# Patient Record
Sex: Female | Born: 1960 | Race: White | Hispanic: No | Marital: Married | Smoking: Never smoker
Health system: Southern US, Community
[De-identification: ages and names within clinical notes are randomized; demographics above are authoritative.]

## PROBLEM LIST (undated history)

## (undated) DIAGNOSIS — Z309 Encounter for contraceptive management, unspecified: Secondary | ICD-10-CM

## (undated) DIAGNOSIS — T7840XA Allergy, unspecified, initial encounter: Secondary | ICD-10-CM

## (undated) DIAGNOSIS — I10 Essential (primary) hypertension: Secondary | ICD-10-CM

## (undated) DIAGNOSIS — B001 Herpesviral vesicular dermatitis: Secondary | ICD-10-CM

## (undated) HISTORY — DX: Allergy, unspecified, initial encounter: T78.40XA

## (undated) HISTORY — PX: COLONOSCOPY: SHX174

## (undated) HISTORY — DX: Encounter for contraceptive management, unspecified: Z30.9

## (undated) HISTORY — DX: Essential (primary) hypertension: I10

## (undated) HISTORY — DX: Herpesviral vesicular dermatitis: B00.1

---
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
## 1984-07-19 HISTORY — PX: WISDOM TOOTH EXTRACTION: SHX21

## 1998-02-17 ENCOUNTER — Inpatient Hospital Stay (HOSPITAL_COMMUNITY): Admission: AD | Admit: 1998-02-17 | Discharge: 1998-02-19 | Payer: Self-pay | Admitting: Obstetrics and Gynecology

## 1998-06-28 ENCOUNTER — Emergency Department (HOSPITAL_COMMUNITY): Admission: EM | Admit: 1998-06-28 | Discharge: 1998-06-28 | Payer: Self-pay | Admitting: Emergency Medicine

## 1998-12-04 ENCOUNTER — Other Ambulatory Visit: Admission: RE | Admit: 1998-12-04 | Discharge: 1998-12-04 | Payer: Self-pay | Admitting: Obstetrics and Gynecology

## 2000-03-02 ENCOUNTER — Other Ambulatory Visit: Admission: RE | Admit: 2000-03-02 | Discharge: 2000-03-02 | Payer: Self-pay | Admitting: Obstetrics and Gynecology

## 2001-04-21 ENCOUNTER — Other Ambulatory Visit: Admission: RE | Admit: 2001-04-21 | Discharge: 2001-04-21 | Payer: Self-pay | Admitting: Obstetrics and Gynecology

## 2002-04-27 ENCOUNTER — Other Ambulatory Visit: Admission: RE | Admit: 2002-04-27 | Discharge: 2002-04-27 | Payer: Self-pay | Admitting: Obstetrics and Gynecology

## 2003-04-26 ENCOUNTER — Other Ambulatory Visit: Admission: RE | Admit: 2003-04-26 | Discharge: 2003-04-26 | Payer: Self-pay | Admitting: Obstetrics and Gynecology

## 2003-05-06 ENCOUNTER — Other Ambulatory Visit: Admission: RE | Admit: 2003-05-06 | Discharge: 2003-05-06 | Payer: Self-pay | Admitting: Obstetrics and Gynecology

## 2004-05-06 ENCOUNTER — Other Ambulatory Visit: Admission: RE | Admit: 2004-05-06 | Discharge: 2004-05-06 | Payer: Self-pay | Admitting: Obstetrics and Gynecology

## 2005-05-03 ENCOUNTER — Other Ambulatory Visit: Admission: RE | Admit: 2005-05-03 | Discharge: 2005-05-03 | Payer: Self-pay | Admitting: Obstetrics and Gynecology

## 2006-09-29 ENCOUNTER — Ambulatory Visit: Payer: Self-pay | Admitting: Family Medicine

## 2007-10-30 ENCOUNTER — Ambulatory Visit: Payer: Self-pay | Admitting: Family Medicine

## 2008-12-06 ENCOUNTER — Ambulatory Visit: Payer: Self-pay | Admitting: Family Medicine

## 2009-04-14 ENCOUNTER — Ambulatory Visit: Payer: Self-pay | Admitting: Family Medicine

## 2009-10-01 ENCOUNTER — Ambulatory Visit: Payer: Self-pay | Admitting: Family Medicine

## 2009-12-10 ENCOUNTER — Ambulatory Visit: Payer: Self-pay | Admitting: Family Medicine

## 2010-05-28 ENCOUNTER — Ambulatory Visit: Payer: Self-pay | Admitting: Family Medicine

## 2011-01-19 ENCOUNTER — Other Ambulatory Visit: Payer: Self-pay | Admitting: Family Medicine

## 2011-01-19 ENCOUNTER — Telehealth: Payer: Self-pay | Admitting: Family Medicine

## 2011-01-19 MED ORDER — FLUTICASONE PROPIONATE 50 MCG/ACT NA SUSP: 2.0000 | Freq: Every day | NASAL | Status: DC

## 2011-01-19 NOTE — Telephone Encounter (Signed)
Flonase renewed. 

## 2011-02-01 ENCOUNTER — Encounter: Payer: Self-pay | Admitting: Family Medicine

## 2011-02-02 ENCOUNTER — Encounter: Payer: Self-pay | Admitting: Medical

## 2011-02-02 ENCOUNTER — Ambulatory Visit (INDEPENDENT_AMBULATORY_CARE_PROVIDER_SITE_OTHER): Payer: 59 | Admitting: Medical

## 2011-02-02 DIAGNOSIS — B009 Herpesviral infection, unspecified: Secondary | ICD-10-CM

## 2011-02-02 DIAGNOSIS — H7390 Unspecified disorder of tympanic membrane, unspecified ear: Secondary | ICD-10-CM

## 2011-02-02 DIAGNOSIS — J309 Allergic rhinitis, unspecified: Secondary | ICD-10-CM

## 2011-02-02 DIAGNOSIS — B001 Herpesviral vesicular dermatitis: Secondary | ICD-10-CM

## 2011-02-02 MED ORDER — FLUTICASONE PROPIONATE 50 MCG/ACT NA SUSP: 2.0000 | Freq: Every day | NASAL | Status: DC

## 2011-02-02 MED ORDER — ACYCLOVIR-HYDROCORTISONE 5-1 % EX CREA: 1.0000 "application " | TOPICAL_CREAM | Freq: Every day | CUTANEOUS | Status: AC

## 2011-02-02 NOTE — Progress Notes (Signed)
  Subjective:   HPI  Julia Romero is a 50 y.o. female who presents for general recheck.  Sees gynecology once yearly, in December, and is UTD on pap and mammo.    Been getting fever blisters of recent.  Gets these once every few months now.  Use to get these all the time as a kid.  Thinks stress or not drinking enough water is contributing to the recent outbreaks.    Here for recheck on allergies.  Needs refills.  Well controlled on daily regimen of Zyrtec and Flonase.   Denies adverse effects of the medication.   No other aggravating or relieving factors.  Otherwise been in normal state of health, exercises regularly, eats healthy.  No other c/o.  The following portions of the patient's history were reviewed and updated as appropriate: allergies, current medications, past family history, past medical history, past social history, past surgical history and problem list.  Past Medical History  Diagnosis Date  . Allergy     RHINITIS  . Herpes labialis   . Contraception management     sees gynecology    Review of Systems Constitutional: denies fever, chills, sweats Allergy: no congestion, sneezing ENT: no runny nose, ear pain, sore throat, hoarseness, sinus pain Cardiology: denies chest pain, palpitations, edema Respiratory: denies cough, shortness of breath, wheezing Gastroenterology: denies abdominal pain, nausea, vomiting, diarrhea, constipation Neurology: no headache, weakness, tingling, numbness      Objective:   Physical Exam  General appearance: alert, no distress, WD/WN, white female HEENT: normocephalic, sclerae anicteric, PERRLA, EOMi, right TM with scar tissue posteriorly, left TM with posterior roundish somewhat retracted lesion with central opaque bulges, asymmetric compared to the right, nares patent, no discharge or erythema, pharynx normal Oral cavity: MMM, no lesions Neck: supple, no lymphadenopathy, no thyromegaly, no masses Heart: RRR, normal S1, S2, no  murmurs Lungs: CTA bilaterally, no wheezes, rhonchi, or rales   Assessment :    Encounter Diagnoses  Name Primary?  . Allergic rhinitis Yes  . Herpes labialis   . Abnormal tympanic membrane      Plan:   Allergic rhinitis - does well on current regimen of Zyrtec and Flonase, uses daily year round.  C/t same meds.  Herpes Labialis - trial of Acyclovir-Hydrocortisone cream prn for flares.  She does not want to use tablets or suppressive therapy.  Discussed proper use of medication, means of transmission.  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  Will refer to ENT for further eval.   We will send for copy of gyn notes and recent comprehensive labs.

## 2011-10-01 ENCOUNTER — Ambulatory Visit (INDEPENDENT_AMBULATORY_CARE_PROVIDER_SITE_OTHER): Payer: 59 | Admitting: Family Medicine

## 2011-10-01 ENCOUNTER — Encounter: Payer: Self-pay | Admitting: Family Medicine

## 2011-10-01 VITALS — BP 120/80 | HR 98 | Temp 97.7°F | Resp 16 | Wt 187.0 lb

## 2011-10-01 DIAGNOSIS — J309 Allergic rhinitis, unspecified: Secondary | ICD-10-CM

## 2011-10-01 DIAGNOSIS — R05 Cough: Secondary | ICD-10-CM

## 2011-10-01 DIAGNOSIS — B001 Herpesviral vesicular dermatitis: Secondary | ICD-10-CM | POA: Insufficient documentation

## 2011-10-01 DIAGNOSIS — R059 Cough, unspecified: Secondary | ICD-10-CM

## 2011-10-01 MED ORDER — HYDROCODONE-HOMATROPINE 5-1.5 MG/5ML PO SYRP: 5.0000 mL | ORAL_SOLUTION | Freq: Three times a day (TID) | ORAL | Status: AC | PRN

## 2011-10-01 MED ORDER — BENZONATATE 200 MG PO CAPS: 200.0000 mg | ORAL_CAPSULE | Freq: Three times a day (TID) | ORAL | Status: AC | PRN

## 2011-10-01 MED ORDER — ALBUTEROL SULFATE (5 MG/ML) 0.5% IN NEBU
2.5000 mg | INHALATION_SOLUTION | Freq: Once | RESPIRATORY_TRACT | Status: AC
  Administered 2011-10-01: 2.5 mg via RESPIRATORY_TRACT

## 2011-10-01 NOTE — Progress Notes (Signed)
Patient presents with complaint of chest congestion, nasal congestion, cough.  Symptoms began 3 days ago, getting worse.  Has been using generic Mucinex.  Cough is dry.  +postnasal drainage.  Nose is stuffy, can't blow anything out.  She feels that her allergies are flaring.  Takes Zyrtec and flonase daily.  +itchy eyes x 1 week.  Thought she may have had a fever 2 nights ago because she woke up sweating, none since. +sore throat, hoarseness.  One night woke up with her chest feeling like it was on fire and had a hard time breathing, no similar problems since.    Past Medical History  Diagnosis Date  . Allergy     RHINITIS  . Herpes labialis   . Contraception management     sees gynecology    No past surgical history on file.  History   Social History  . Marital Status: Married    Spouse Name: N/A    Number of Children: N/A  . Years of Education: N/A   Occupational History  . Not on file.   Social History Main Topics  . Smoking status: Never Smoker   . Smokeless tobacco: Never Used  . Alcohol Use: 5.0 oz/week    10 drink(s) per week  . Drug Use: No  . Sexually Active: Not on file     exercise - body pump and zumba; [employer] services   Other Topics Concern  . Not on file   Social History Narrative  . No narrative on file    Family History  Problem Relation Age of Onset  . Hypertension Mother   . Diabetes Maternal Aunt   . Cancer Maternal Uncle   . Cancer Paternal Aunt   . Diabetes Paternal Aunt   . Arthritis Maternal Grandmother   . Heart disease Neg Hx   . Stroke Neg Hx   . COPD Neg Hx    Current Outpatient Prescriptions on File Prior to Visit  Medication Sig Dispense Refill  . Acyclovir-Hydrocortisone 5-1 % CREA Apply 1 application topically 5 (five) times daily. Apply 5 times daily x 5 days  5 g  5  . cetirizine (ZYRTEC) 10 MG tablet Take 10 mg by mouth daily.        . fluticasone (FLONASE) 50 MCG/ACT nasal spray Place 2 sprays into the nose daily.  16 g  11    No current facility-administered medications on file prior to visit.   No Known Allergies  ROS:  Denies nausea, vomiting, diarrhea, skin rash, myalgias/arthralgias.  Denies headaches/dizziness.  See HPI  PHYSICAL EXAM: BP 120/80  Pulse 98  Temp(Src) 97.7 F (36.5 C) (Oral)  Resp 16  Wt 187 lb (84.823 kg)  SpO2 98% Well developed, pleasant female, in no distress, but with dry hacky cough.  Increased cough with speaking, forced expiration HEENT:  PERRL, EOMI, conjunctiva clear.  TM's and EACs normal. OP white phlegm posteriorly Sinuses nontender Nasal mucosa mildly edematous with clear mucus Neck: no lymphadenopathy Heart: regular rate and rhythm without murmur Lungs: clear bilaterally.  Slightly decreased breath sounds, and some wheezing with forced expiration. No wheezes, rales, ronchi After neb--slightly improved air movement.  Pt didn't feel significantly improved. Skin: no rash Psych: normal mood, affect  ASSESSMENT/PLAN: 1. Allergic rhinitis, cause unspecified    2. Cough  benzonatate (TESSALON) 200 MG capsule, HYDROcodone-homatropine (HYCODAN) 5-1.5 MG/5ML syrup, albuterol (PROVENTIL) (5 MG/ML) 0.5% nebulizer solution 2.5 mg   Add decongestant.  Continue Mucinex (plain, or if you get Mucinex-D,  then don't get separate decongestant). Continue allergy meds--Flonase and Zyrtec Tessalon during day Hycodan at night Call for antibiotics if ongoing cough, or develops discolored mucus/phlegm.

## 2011-10-01 NOTE — Patient Instructions (Signed)
  Add decongestant (ie Sudafed).  Continue Mucinex (plain, or if you get Mucinex-D, then don't get separate decongestant). Continue allergy meds--Flonase and Zyrtec Tessalon during day if needed for cough Hycodan at night for cough Call for antibiotics if ongoing cough, or develops discolored mucus/phlegm.

## 2011-12-08 ENCOUNTER — Encounter: Payer: Self-pay | Admitting: Family Medicine

## 2011-12-08 ENCOUNTER — Ambulatory Visit (INDEPENDENT_AMBULATORY_CARE_PROVIDER_SITE_OTHER): Payer: 59 | Admitting: Family Medicine

## 2011-12-08 VITALS — BP 126/90 | HR 90 | Wt 182.0 lb

## 2011-12-08 DIAGNOSIS — M7650 Patellar tendinitis, unspecified knee: Secondary | ICD-10-CM

## 2011-12-08 MED ORDER — DICLOFENAC SODIUM 75 MG PO TBEC: 75.0000 mg | DELAYED_RELEASE_TABLET | Freq: Two times a day (BID) | ORAL | Status: AC

## 2011-12-08 NOTE — Progress Notes (Signed)
  Subjective:    Patient ID: Julia Romero, female    DOB: 09/14/1960, 51 y.o.   MRN: [id number]  HPI Approximately 3 days ago she noted the onset of bilateral lateral knee pain. Both sides is in exactly the same area. Any activity makes it worse. It is especially bothersome up and down steps. She's had no new vigorous physical activities. No history of injury. No other joints are involved. She has been using 2 Aleve twice a day without relief. She then switch to ibuprofen 600 mg 4 times per day without Review of Systems     Objective:   Physical Exam Slight effusion is noted bilaterally as well as point tenderness over the proximal lateral patellar tendon at the insertion into the patella no joint line tenderness. McMurray's testing negative. These exams were all bilateral exam     Assessment & Plan:   1. Patellar tendinitis, unspecified laterality  diclofenac (VOLTAREN) 75 MG EC tablet  Heat for 20 minutes 3 times per day. Take the new medication regularly. If continued difficulty return here. You can also add Tylenol to your regimen.

## 2011-12-08 NOTE — Patient Instructions (Addendum)
Heat for 20 minutes 3 times per day. Take the new medication regularly. If continued difficulty return here. You can also add Tylenol to your regimen.

## 2012-03-14 ENCOUNTER — Other Ambulatory Visit: Payer: Self-pay | Admitting: Medical

## 2012-03-14 ENCOUNTER — Telehealth: Payer: Self-pay | Admitting: Family Medicine

## 2012-03-14 MED ORDER — FLUTICASONE PROPIONATE 50 MCG/ACT NA SUSP: 2.0000 | Freq: Every day | NASAL | Status: DC

## 2012-03-14 NOTE — Telephone Encounter (Signed)
Flonase called in

## 2012-04-21 ENCOUNTER — Other Ambulatory Visit: Payer: Self-pay | Admitting: Family Medicine

## 2012-04-21 NOTE — Telephone Encounter (Signed)
Is this ok?

## 2012-04-21 NOTE — Telephone Encounter (Signed)
Go ahead and renew this 

## 2012-06-01 ENCOUNTER — Ambulatory Visit (INDEPENDENT_AMBULATORY_CARE_PROVIDER_SITE_OTHER): Payer: 59 | Admitting: Family Medicine

## 2012-06-01 ENCOUNTER — Encounter: Payer: Self-pay | Admitting: Family Medicine

## 2012-06-01 VITALS — BP 126/82 | HR 76 | Wt 189.0 lb

## 2012-06-01 DIAGNOSIS — J019 Acute sinusitis, unspecified: Secondary | ICD-10-CM

## 2012-06-01 DIAGNOSIS — H6690 Otitis media, unspecified, unspecified ear: Secondary | ICD-10-CM

## 2012-06-01 DIAGNOSIS — H6691 Otitis media, unspecified, right ear: Secondary | ICD-10-CM

## 2012-06-01 MED ORDER — AMOXICILLIN 875 MG PO TABS: 875.0000 mg | ORAL_TABLET | Freq: Two times a day (BID) | ORAL | Status: DC

## 2012-06-01 NOTE — Patient Instructions (Signed)
Take all the antibiotic and return here in about 2 weeks

## 2012-06-01 NOTE — Progress Notes (Signed)
  Subjective:    Patient ID: Julia Romero, female    DOB: 1960/11/16, 51 y.o.   MRN: [id number]  HPI Approximately one week ago she noted sinus pressure, PND, rhinorrhea. She states that the congestion is worse and now has increased pressure and dry cough. He also developed some right ear pain. No fever, chills, sore throat area she did try Mucinex.   Review of Systems     Objective:   Physical Exam alert and in no distress. Left tympanic membrane is normal, right is vascular with poor anatomic landmarks canals are normal. Throat is clear. Tonsils are normal. Neck is supple without adenopathy or thyromegaly. Cardiac exam shows a regular sinus rhythm without murmurs or gallops. Lungs are clear to auscultation. Tender to palpation over all sinuses. Nasal mucosa slightly erythematous.       Assessment & Plan:   1. ROM (right otitis media)  amoxicillin (AMOXIL) 875 MG tablet  2. Acute sinusitis  amoxicillin (AMOXIL) 875 MG tablet   she is to return here in 2 weeks for recheck.

## 2012-06-09 ENCOUNTER — Encounter: Payer: Self-pay | Admitting: Internal Medicine

## 2012-06-12 ENCOUNTER — Telehealth: Payer: Self-pay | Admitting: Family Medicine

## 2012-06-12 DIAGNOSIS — J019 Acute sinusitis, unspecified: Secondary | ICD-10-CM

## 2012-06-12 DIAGNOSIS — H6691 Otitis media, unspecified, right ear: Secondary | ICD-10-CM

## 2012-06-12 MED ORDER — AMOXICILLIN 875 MG PO TABS: 875.0000 mg | ORAL_TABLET | Freq: Two times a day (BID) | ORAL | Status: DC

## 2012-06-12 NOTE — Telephone Encounter (Signed)
Patient still having symptoms. I will call in  another dose of Amoxil

## 2012-06-12 NOTE — Telephone Encounter (Signed)
Let her I called in the Amoxil

## 2012-06-12 NOTE — Telephone Encounter (Signed)
Pt informed of med has been called in

## 2012-06-18 LAB — HM PAP SMEAR: HM Pap smear: NORMAL

## 2012-06-20 ENCOUNTER — Ambulatory Visit (INDEPENDENT_AMBULATORY_CARE_PROVIDER_SITE_OTHER): Payer: 59 | Admitting: Family Medicine

## 2012-06-20 VITALS — BP 120/80 | HR 73 | Wt 189.0 lb

## 2012-06-20 DIAGNOSIS — H6690 Otitis media, unspecified, unspecified ear: Secondary | ICD-10-CM

## 2012-06-20 DIAGNOSIS — H6693 Otitis media, unspecified, bilateral: Secondary | ICD-10-CM

## 2012-06-20 MED ORDER — AMOXICILLIN 875 MG PO TABS: 875.0000 mg | ORAL_TABLET | Freq: Two times a day (BID) | ORAL | Status: DC

## 2012-06-20 NOTE — Patient Instructions (Signed)
Take ALL of the antibiotic and call me if you're not back to normal

## 2012-06-20 NOTE — Progress Notes (Signed)
  Subjective:    Patient ID: Julia Romero, female    DOB: 1960/11/20, 51 y.o.   MRN: [id number]  HPI She is here for recheck. She stopped taking her antibiotic after one week but did note some ear congestion but no fever or chills.   Review of Systems     Objective:   Physical Exam Alert and in no distress. Both tympanic membranes show good anatomic landmarks however the membranes are slightly retracted with some fluid behind it.       Assessment & Plan:   1. BOM (bilateral otitis media)    she will take a full ten-day course of the antibiotic and if not improved back to normal, will give me a call.

## 2012-08-11 ENCOUNTER — Ambulatory Visit (INDEPENDENT_AMBULATORY_CARE_PROVIDER_SITE_OTHER): Payer: 59 | Admitting: Family Medicine

## 2012-08-11 DIAGNOSIS — T23209A Burn of second degree of unspecified hand, unspecified site, initial encounter: Secondary | ICD-10-CM

## 2012-08-11 DIAGNOSIS — T23202A Burn of second degree of left hand, unspecified site, initial encounter: Secondary | ICD-10-CM

## 2012-08-11 NOTE — Progress Notes (Signed)
  Subjective:    Patient ID: Julia Romero, female    DOB: [birth year], 52 y.o.   MRN: [id number]  HPI She sustained a burn to the base of her left index finger earlier today.   Review of Systems     Objective:   Physical Exam 1 x 2 cm slightly red blistered lesion noted on the palm are and dorsal surface of the left index finger just distal to the MCP joint is noted. The blisters have opened spontaneously.      Assessment & Plan:   1. Second degree burn of left hand    the wound was cleaned and dressed. Instructed the patient on proper care of the wound and to watch for signs of infection. He the wound covered until it becomes dry and in fact that to protect against further injury. Call if any questions about infection.

## 2012-08-28 ENCOUNTER — Ambulatory Visit (INDEPENDENT_AMBULATORY_CARE_PROVIDER_SITE_OTHER): Payer: 59 | Admitting: Internal Medicine

## 2012-08-28 ENCOUNTER — Telehealth: Payer: Self-pay | Admitting: Family Medicine

## 2012-08-28 VITALS — BP 147/85 | HR 74 | Temp 98.2°F | Resp 18 | Ht 66.0 in | Wt 188.0 lb

## 2012-08-28 DIAGNOSIS — J329 Chronic sinusitis, unspecified: Secondary | ICD-10-CM

## 2012-08-28 MED ORDER — PREDNISONE 20 MG PO TABS: ORAL_TABLET | ORAL | Status: DC

## 2012-08-28 MED ORDER — AMOXICILLIN-POT CLAVULANATE 875-125 MG PO TABS: 1.0000 | ORAL_TABLET | Freq: Two times a day (BID) | ORAL | Status: DC

## 2012-08-28 NOTE — Progress Notes (Signed)
  Subjective:    Patient ID: Julia Romero, female    DOB: 01/27/1961, 52 y.o.   MRN: [id number]  HPI complaining of sinus congestion that is very uncomfortable over the past 6 weeks History of chronic sinus problems Had 2 rounds of antibiotics in December and only partially improved The past 2 weeks has had purulent discharge in the morning but the rest of the day it's hard to clear Difficulty breathing at night On Flonase History of allergic rhinitis on Zyrtec Using Sudafed   Review of Systems     Objective:   Physical Exam Overweight Blood pressure 147/85 TMs with scars Nares boggy with little air space/purulence present Throat clear No nodes       Assessment & Plan:  Chronic sinusitis  Meds ordered this encounter  Medications  . amoxicillin-clavulanate (AUGMENTIN) 875-125 MG per tablet    Sig: Take 1 tablet by mouth 2 (two) times daily.    Dispense:  20 tablet    Refill:  1  . predniSONE (DELTASONE) 20 MG tablet    Sig: 3/3/2/2/1/1 single daily dose for 6 days    Dispense:  12 tablet    Refill:  0   If no response in 3 weeks call for referral to Dr. Thurmon [facility] practice/she was seen by them years ago

## 2012-08-28 NOTE — Telephone Encounter (Signed)
Says sinus infection again but I do not see that I would treat her for sinus infection therefore will thinks appropriate to see her social schedule an appointment for tomorrow

## 2012-08-28 NOTE — Telephone Encounter (Signed)
Sinus infection again, would like to have Rx called in   Please call patient  [facility] [address]

## 2012-08-28 NOTE — Telephone Encounter (Signed)
CALLED PT SHE SAID SHE DO SENT HAVE TIME TO COME IN SO SHE WILL GO TO URGENT CARE

## 2013-04-16 ENCOUNTER — Ambulatory Visit (INDEPENDENT_AMBULATORY_CARE_PROVIDER_SITE_OTHER): Payer: 59 | Admitting: Family Medicine

## 2013-04-16 ENCOUNTER — Encounter: Payer: Self-pay | Admitting: Family Medicine

## 2013-04-16 ENCOUNTER — Encounter: Payer: Self-pay | Admitting: Internal Medicine

## 2013-04-16 VITALS — BP 120/82 | HR 72 | Wt 198.0 lb

## 2013-04-16 DIAGNOSIS — Z23 Encounter for immunization: Secondary | ICD-10-CM

## 2013-04-16 DIAGNOSIS — B001 Herpesviral vesicular dermatitis: Secondary | ICD-10-CM

## 2013-04-16 DIAGNOSIS — Z79899 Other long term (current) drug therapy: Secondary | ICD-10-CM

## 2013-04-16 DIAGNOSIS — B009 Herpesviral infection, unspecified: Secondary | ICD-10-CM

## 2013-04-16 DIAGNOSIS — J309 Allergic rhinitis, unspecified: Secondary | ICD-10-CM

## 2013-04-16 MED ORDER — ACYCLOVIR-HYDROCORTISONE 5-1 % EX CREA: TOPICAL_CREAM | CUTANEOUS | Status: DC

## 2013-04-16 MED ORDER — AZELASTINE-FLUTICASONE 137-50 MCG/ACT NA SUSP: 1.0000 | NASAL | Status: DC

## 2013-04-16 NOTE — Progress Notes (Signed)
  Subjective:    Patient ID: Julia Romero, female    DOB: 1960/07/20, 52 y.o.   MRN: [id number]  HPI She is here for medication check. She does have underlying allergies and has been using Zyrtec as well as Flonase. She would like to try a new medication with the combination of these two thinking this also might help with cost. She seems to be doing well on the combination mentioned above. She also would like a refill on her herpes labialis cream stating this works quite well. Review his record indicates she is now over 50 and has not had a colonoscopy.   Review of Systems     Objective:   Physical Exam Alert and in no distress otherwise not examined       Assessment & Plan:  Allergic rhinitis, cause unspecified - Plan: Azelastine-Fluticasone (DYMISTA) 137-50 MCG/ACT SUSP  Herpes labialis - Plan: Acyclovir-Hydrocortisone (XERESE) 5-1 % CREA  Need for prophylactic vaccination and inoculation against influenza - Plan: Flu Vaccine QUAD 36+ mos IM  Encounter for long-term (current) use of other medications - Plan: Ambulatory referral to Gastroenterology  flu shot given with risks and benefits discussed. Her medications were renewed. I discussed the fact that her insurance might not cover this medication and if so, she will let me know.

## 2013-04-17 ENCOUNTER — Telehealth: Payer: Self-pay | Admitting: Internal Medicine

## 2013-04-17 MED ORDER — FLUTICASONE PROPIONATE 50 MCG/ACT NA SUSP: 2.0000 | Freq: Every day | NASAL | Status: DC

## 2013-04-17 MED ORDER — AZELASTINE HCL 0.1 % NA SOLN: 1.0000 | Freq: Two times a day (BID) | NASAL | Status: DC

## 2013-04-17 NOTE — Telephone Encounter (Signed)
Fax came in stating that insurance will not pay for dymista that it will cover flonase or astelin. So please send in a new rx to [facility] [address]

## 2013-04-24 ENCOUNTER — Ambulatory Visit (INDEPENDENT_AMBULATORY_CARE_PROVIDER_SITE_OTHER): Payer: 59 | Admitting: Family Medicine

## 2013-04-24 DIAGNOSIS — L909 Atrophic disorder of skin, unspecified: Secondary | ICD-10-CM

## 2013-04-24 DIAGNOSIS — L918 Other hypertrophic disorders of the skin: Secondary | ICD-10-CM

## 2013-04-24 NOTE — Progress Notes (Signed)
  Subjective:    Patient ID: Julia Romero, female    DOB: 1960/09/11, 52 y.o.   MRN: [id number]  HPI She has 2 skin tags in her left axilla that are giving him trouble when she wears clothing. She gets some caught on them and they become irritated.   Review of Systems     Objective:   Physical Exam Exam of the left axilla shows 2 skin tags however they're not raw and irritated.       Assessment & Plan:  Cutaneous skin tags  skin tags were cleaned with alcohol and removed with scissors. The base was hyfrecated with silver nitrate. She tolerated the procedure well.

## 2013-06-06 ENCOUNTER — Ambulatory Visit (AMBULATORY_SURGERY_CENTER): Payer: Self-pay | Admitting: *Deleted

## 2013-06-06 VITALS — Ht 65.5 in | Wt 197.2 lb

## 2013-06-06 DIAGNOSIS — Z1211 Encounter for screening for malignant neoplasm of colon: Secondary | ICD-10-CM

## 2013-06-06 MED ORDER — MOVIPREP 100 G PO SOLR: ORAL | Status: DC

## 2013-06-06 NOTE — Progress Notes (Signed)
No allergies to eggs or soy. No problems with anesthesia.  

## 2013-06-07 ENCOUNTER — Encounter: Payer: Self-pay | Admitting: Internal Medicine

## 2013-06-21 ENCOUNTER — Encounter: Payer: Self-pay | Admitting: Internal Medicine

## 2013-06-21 ENCOUNTER — Ambulatory Visit (AMBULATORY_SURGERY_CENTER): Payer: 59 | Admitting: Internal Medicine

## 2013-06-21 VITALS — BP 133/86 | HR 61 | Temp 99.4°F | Resp 18 | Ht 65.0 in | Wt 197.0 lb

## 2013-06-21 DIAGNOSIS — D126 Benign neoplasm of colon, unspecified: Secondary | ICD-10-CM

## 2013-06-21 DIAGNOSIS — Z1211 Encounter for screening for malignant neoplasm of colon: Secondary | ICD-10-CM

## 2013-06-21 LAB — HM COLONOSCOPY

## 2013-06-21 MED ORDER — SODIUM CHLORIDE 0.9 % IV SOLN: 500.0000 mL | INTRAVENOUS | Status: DC

## 2013-06-21 NOTE — Patient Instructions (Signed)
YOU HAD AN ENDOSCOPIC PROCEDURE TODAY AT THE [facility]: Refer to the procedure report that was given to you for any specific questions about what was found during the examination.  If the procedure report does not answer your questions, please call your gastroenterologist to clarify.  If you requested that your care partner not be given the details of your procedure findings, then the procedure report has been included in a sealed envelope for you to review at your convenience later.  YOU SHOULD EXPECT: Some feelings of bloating in the abdomen. Passage of more gas than usual.  Walking can help get rid of the air that was put into your GI tract during the procedure and reduce the bloating. If you had a lower endoscopy (such as a colonoscopy or flexible sigmoidoscopy) you may notice spotting of blood in your stool or on the toilet paper. If you underwent a bowel prep for your procedure, then you may not have a normal bowel movement for a few days.  DIET: Your first meal following the procedure should be a light meal and then it is ok to progress to your normal diet.  A half-sandwich or bowl of soup is an example of a good first meal.  Heavy or fried foods are harder to digest and may make you feel nauseous or bloated.  Likewise meals heavy in dairy and vegetables can cause extra gas to form and this can also increase the bloating.  Drink plenty of fluids but you should avoid alcoholic beverages for 24 hours.  ACTIVITY: Your care partner should take you home directly after the procedure.  You should plan to take it easy, moving slowly for the rest of the day.  You can resume normal activity the day after the procedure however you should NOT DRIVE or use heavy machinery for 24 hours (because of the sedation medicines used during the test).    SYMPTOMS TO REPORT IMMEDIATELY: A gastroenterologist can be reached at any hour.  During normal business hours, 8:30 AM to 5:00 PM Monday through Friday,  call [phone].  After hours and on weekends, please call the GI answering service at [phone]  Emergency number who will take a message and have the physician on call contact you.   Following lower endoscopy (colonoscopy or flexible sigmoidoscopy):  Excessive amounts of blood in the stool  Significant tenderness or worsening of abdominal pains  Swelling of the abdomen that is new, acute  Fever of 100F or higher  FOLLOW UP: If any biopsies were taken you will be contacted by phone or by letter within the next 1-3 weeks.  Call your gastroenterologist if you have not heard about the biopsies in 3 weeks.  Our staff will call the home number listed on your records the next business day following your procedure to check on you and address any questions or concerns that you may have at that time regarding the information given to you following your procedure. This is a courtesy call and so if there is no answer at the home number and we have not heard from you through the emergency physician on call, we will assume that you have returned to your regular daily activities without incident.  SIGNATURES/CONFIDENTIALITY: You and/or your care partner have signed paperwork which will be entered into your electronic medical record.  These signatures attest to the fact that that the information above on your After Visit Summary has been reviewed and is understood.  Full responsibility of the   confidentiality of this discharge information lies with you and/or your care-partner.  HANDOUT ON POLYPS

## 2013-06-21 NOTE — Progress Notes (Signed)
Called to room to assist during endoscopic procedure.  Patient ID and intended procedure confirmed with present staff. Received instructions for my participation in the procedure from the performing physician.  

## 2013-06-21 NOTE — Op Note (Signed)
[facility] [address] [place], [address]   COLONOSCOPY PROCEDURE REPORT  PATIENT: Julia Romero, Julia Romero  MR#: [id number] BIRTHDATE: [birth year] , 52  yrs. old GENDER: Female ENDOSCOPIST: Roxy Cedar, MD REFERRED XB:JYNW Susann Givens, M.D. PROCEDURE DATE:  06/21/2013 PROCEDURE:   Colonoscopy with snare polypectomy x 2 First Screening Colonoscopy - Avg.  risk and is 50 yrs.  old or older Yes.  Prior Negative Screening - Now for repeat screening. N/A  History of Adenoma - Now for follow-up colonoscopy & has been > or = to 3 yrs.  N/A  Polyps Removed Today? Yes. ASA CLASS:   Class II INDICATIONS:average risk screening. MEDICATIONS: MAC sedation, administered by CRNA and propofol (Diprivan) 500mg  IV  DESCRIPTION OF PROCEDURE:   After the risks benefits and alternatives of the procedure were thoroughly explained, informed consent was obtained.  A digital rectal exam revealed no abnormalities of the rectum.   The LB [id number]  endoscope was introduced through the anus and advanced to the cecum, which was identified by both the appendix and ileocecal valve. No adverse events experienced.   The quality of the prep was excellent, using MoviPrep  The instrument was then slowly withdrawn as the colon was fully examined.  COLON FINDINGS: Three diminutive polyps were found in the ascending colon and transverse colon.  A polypectomy was performed with a cold snare.  The resection was complete and the polyp tissue was completely retrieved.   The colon was otherwise normal.  There was no diverticulosis, inflammation, other polyps or cancers . Retroflexed views revealed internal hemorrhoids. The time to cecum=10 minutes 30 seconds.  Withdrawal time=10 minutes 12 seconds.  The scope was withdrawn and the procedure completed.  COMPLICATIONS: There were no complications.  ENDOSCOPIC IMPRESSION: 1.   Three diminutive polyps were found in the colon; polypectomy was  performed with a cold snare 2.   The colon was otherwise normal  RECOMMENDATIONS: 1. Repeat colonoscopy in 5 years if polyp adenomatous; otherwise 10 years   eSigned:  Roxy Cedar, MD 06/21/2013 12:11 PM   cc: Sharlot Gowda, MD and The Patient

## 2013-06-21 NOTE — Progress Notes (Signed)
Patient did not experience any of the following events: a burn prior to discharge; a fall within the facility; wrong site/side/patient/procedure/implant event; or a hospital transfer or hospital admission upon discharge from the facility. (G8907)Patient did not have preoperative order for IV antibiotic SSI prophylaxis. (G8918) EWM 

## 2013-06-22 ENCOUNTER — Telehealth: Payer: Self-pay | Admitting: *Deleted

## 2013-06-22 NOTE — Telephone Encounter (Signed)
  Follow up Call-  Call back number 06/21/2013  Post procedure Call Back phone  # [phone]  Permission to leave phone message Yes     Patient questions:  Do you have a fever, pain , or abdominal swelling? no Pain Score  0 *  Have you tolerated food without any problems? yes  Have you been able to return to your normal activities? yes  Do you have any questions about your discharge instructions: Diet   no Medications  no Follow up visit  no  Do you have questions or concerns about your Care? no  Actions: * If pain score is 4 or above: No action needed, pain <4.

## 2013-06-26 ENCOUNTER — Encounter: Payer: Self-pay | Admitting: Internal Medicine

## 2013-07-09 LAB — HM MAMMOGRAPHY: HM MAMMO: NORMAL

## 2013-08-13 ENCOUNTER — Encounter: Payer: Self-pay | Admitting: Medical

## 2013-08-13 ENCOUNTER — Ambulatory Visit (INDEPENDENT_AMBULATORY_CARE_PROVIDER_SITE_OTHER): Payer: 59 | Admitting: Medical

## 2013-08-13 VITALS — BP 120/80 | HR 67 | Temp 98.0°F | Resp 16 | Wt 194.0 lb

## 2013-08-13 DIAGNOSIS — H6690 Otitis media, unspecified, unspecified ear: Secondary | ICD-10-CM

## 2013-08-13 MED ORDER — AMOXICILLIN 875 MG PO TABS: 875.0000 mg | ORAL_TABLET | Freq: Two times a day (BID) | ORAL | Status: DC

## 2013-08-13 NOTE — Progress Notes (Signed)
Subjective:   Julia Romero is a 53 y.o. female who presents with ear pain and possible ear infection.  She notes she has had a cold or head congestion for 12 days, including ear pressure, ear popping, cough, head congestion, some chest congestion. Most of the symptoms have improved using Sudafed and Mucinex, however her ears are really hurting her.   Denies fever, sore throat, severe cough, chest congestion, wheezing or shortness of breath, no nausea or vomiting. Denies sick contacts.  No other aggravating or relieving factors.  No other c/o.  ROS as in subjective  Objective:  Filed Vitals:   08/13/13 1425  BP: 120/80  Pulse: 67  Temp: 98 F (36.7 C)  Resp: 16    General appearance: Alert, WD/WN, no distress, mildly ill appearing                             Skin: warm, no rash                           Head: no sinus tenderness                            Eyes: conjunctiva normal, corneas clear, PERRLA                            Ears: Bilateral TMs with retraction, serous effusions, mild erythema, external ear canals normal                          Nose: septum midline, turbinates swollen, with erythema and clear discharge             Mouth/throat: MMM, tongue normal, mild pharyngeal erythema                           Neck: supple, no adenopathy, no thyromegaly, nontender                         Lungs: CTA bilaterally, no wheezes, rales, or rhonchi     Assessment: Encounter Diagnosis  Name Primary?  . Otitis media Yes    Plan: Prescription for amoxicillin given as below.  Discussed diagnosis and treatment, increase Flonase to twice a day for the next week or 2, continue over-the-counter remedies she is using, good hydration.  Tylenol or Ibuprofen OTC for fever and malaise.  Call/return in 2-3 days if symptoms aren't resolving.

## 2013-08-22 ENCOUNTER — Telehealth: Payer: Self-pay | Admitting: Medical

## 2013-08-22 ENCOUNTER — Other Ambulatory Visit: Payer: Self-pay | Admitting: Medical

## 2013-08-22 MED ORDER — AMOXICILLIN-POT CLAVULANATE 875-125 MG PO TABS: 1.0000 | ORAL_TABLET | Freq: Two times a day (BID) | ORAL | Status: DC

## 2013-08-22 NOTE — Telephone Encounter (Signed)
So does she still have ear pain, sinus pressure?  Is she using her 2 nasal sprays?  Using and nasal saline  I did send Augmentin, stronger antibiotic, but find out about the above.

## 2013-08-22 NOTE — Telephone Encounter (Signed)
Patient states that she is still having sinus pressure and her ear's are a little better. She states that she is still using the nasal sprays. Patient is aware of the antibiotic. CLS

## 2013-10-25 ENCOUNTER — Telehealth: Payer: Self-pay | Admitting: Internal Medicine

## 2013-10-25 NOTE — Telephone Encounter (Signed)
Faxed over medical records to [facility]

## 2014-05-09 ENCOUNTER — Ambulatory Visit (INDEPENDENT_AMBULATORY_CARE_PROVIDER_SITE_OTHER): Payer: 59 | Admitting: Family Medicine

## 2014-05-09 ENCOUNTER — Encounter: Payer: Self-pay | Admitting: Family Medicine

## 2014-05-09 VITALS — BP 120/80 | HR 75 | Ht 67.0 in | Wt 195.0 lb

## 2014-05-09 DIAGNOSIS — Z23 Encounter for immunization: Secondary | ICD-10-CM

## 2014-05-09 DIAGNOSIS — J301 Allergic rhinitis due to pollen: Secondary | ICD-10-CM

## 2014-05-09 DIAGNOSIS — B001 Herpesviral vesicular dermatitis: Secondary | ICD-10-CM

## 2014-05-09 MED ORDER — AZELASTINE HCL 0.1 % NA SOLN: 1.0000 | Freq: Two times a day (BID) | NASAL | Status: DC

## 2014-05-09 MED ORDER — ACYCLOVIR-HYDROCORTISONE 5-1 % EX CREA: TOPICAL_CREAM | CUTANEOUS | Status: DC

## 2014-05-09 MED ORDER — FLUTICASONE PROPIONATE 50 MCG/ACT NA SUSP: 1.0000 | Freq: Every day | NASAL | Status: DC

## 2014-05-09 NOTE — Progress Notes (Signed)
   Subjective:    Patient ID: Julia Romero, female    DOB: 1961/07/05, 53 y.o.   MRN: [id number]  HPI She is here for an interval evaluation. She would like refills on several of her medicines specifically for her recurrent herpes labialis. She does have underlying allergies and would like to continue on her present medications. She is doing well on them. She has no other concerns or complaints. Her immunizations and health maintenance were reviewed. Family and social history are unchanged. She has no particular concerns or complaints.   Review of Systems     Objective:   Physical Exam alert and in no distress. Tympanic membranes and canals are normal. Throat is clear. Tonsils are normal. Neck is supple without adenopathy or thyromegaly. Cardiac exam shows a regular sinus rhythm without murmurs or gallops. Lungs are clear to auscultation.        Assessment & Plan:  Herpes labialis - Plan: Acyclovir-Hydrocortisone (XERESE) 5-1 % CREA  Allergic rhinitis due to pollen - Plan: azelastine (ASTELIN) 0.1 % nasal spray, fluticasone (FLONASE) 50 MCG/ACT nasal spray  Need for prophylactic vaccination and inoculation against influenza - Plan: Flu Vaccine QUAD 36+ mos IM

## 2014-05-19 ENCOUNTER — Ambulatory Visit (INDEPENDENT_AMBULATORY_CARE_PROVIDER_SITE_OTHER): Payer: 59 | Admitting: Emergency Medicine

## 2014-05-19 ENCOUNTER — Ambulatory Visit (INDEPENDENT_AMBULATORY_CARE_PROVIDER_SITE_OTHER): Payer: 59

## 2014-05-19 VITALS — BP 118/78 | HR 79 | Temp 98.3°F | Resp 16 | Ht 67.0 in | Wt 194.0 lb

## 2014-05-19 DIAGNOSIS — M25531 Pain in right wrist: Secondary | ICD-10-CM

## 2014-05-19 DIAGNOSIS — M654 Radial styloid tenosynovitis [de Quervain]: Secondary | ICD-10-CM

## 2014-05-19 MED ORDER — NAPROXEN SODIUM 550 MG PO TABS: 550.0000 mg | ORAL_TABLET | Freq: Two times a day (BID) | ORAL | Status: AC

## 2014-05-19 NOTE — Patient Instructions (Signed)
De Quervain's Tenosynovitis De Quervain's tenosynovitis involves inflammation of one or two tendon linings (sheaths) or strain of one or two tendons to the thumb: extensor pollicis brevis (EPB), or abductor pollicis longus (APL). This causes pain on the side of the wrist and base of the thumb. Tendon sheaths secrete a fluid that lubricates the tendon, allowing the tendon to move smoothly. When the sheath becomes inflamed, the tendon cannot move freely in the sheath. Both the EPB and APL tendons are important for proper use of the hand. The EPB tendon is important for straightening the thumb. The APL tendon is important for moving the thumb away from the index finger (abducting). The two tendons pass through a small tube (canal) in the wrist, near the base of the thumb. When the tendons become inflamed, pain is usually felt in this area. SYMPTOMS   Pain, tenderness, swelling, warmth, or redness over the base of the thumb and thumb side of the wrist.  Pain that gets worse when straightening the thumb.  Pain that gets worse when moving the thumb away from the index finger, against resistance.  Pain with pinching or gripping.  Locking or catching of the thumb.  Limited motion of the thumb.  Crackling sound (crepitation) when the tendon or thumb is moved or touched.  Fluid-filled cyst in the area of the base of the thumb. CAUSES   Tenosynovitis is often linked with overuse of the wrist.  Tenosynovitis may be caused by repeated injury to the thumb muscle and tendon units, and with repeated motions of the hand and wrist, due to friction of the tendon within the lining (sheath).  Tenosynovitis may also be due to a sudden increase in activity or change in activity. RISK INCREASES WITH:  Sports that involve repeated hand and wrist motions (golf, bowling, tennis, squash, racquetball).  Heavy labor.  Poor physical wrist strength and flexibility.  Failure to warm up properly before practice or  play.  Female gender.  New mothers who hold their baby's head for long periods or lift infants with thumbs in the infant's armpit (axilla). PREVENTION  Warm up and stretch properly before practice or competition.  Allow enough time for rest and recovery between practices and competition.  Maintain appropriate conditioning:  Cardiovascular fitness.  Forearm, wrist, and hand flexibility.  Muscle strength and endurance.  Use proper exercise technique. PROGNOSIS  This condition is usually curable within 6 weeks, if treated properly with non-surgical treatment and resting of the affected area.  RELATED COMPLICATIONS   Longer healing time if not properly treated or if not given enough time to heal.  Chronic inflammation, causing recurring symptoms of tenosynovitis. Permanent pain or restriction of movement.  Risks of surgery: infection, bleeding, injury to nerves (numbness of the thumb), continued pain, incomplete release of the tendon sheath, recurring symptoms, cutting of the tendons, tendons sliding out of position, weakness of the thumb, thumb stiffness. TREATMENT  First, treatment involves the use of medicine and ice, to reduce pain and inflammation. Patients are encouraged to stop or modify activities that aggravate the injury. Stretching and strengthening exercises may be advised. Exercises may be completed at home or with a therapist. You may be fitted with a brace or splint, to limit motion and allow the injury to heal. Your caregiver may also choose to give you a corticosteroid injection, to reduce the pain and inflammation. If non-surgical treatment is not successful, surgery may be needed. Most tenosynovitis surgeries are done as outpatient procedures (you go home the   same day). Surgery may involve local, regional (whole arm), or general anesthesia.  MEDICATION   If pain medicine is needed, nonsteroidal anti-inflammatory medicines (aspirin and ibuprofen), or other minor pain  relievers (acetaminophen), are often advised.  Do not take pain medicine for 7 days before surgery.  Prescription pain relievers are often prescribed only after surgery. Use only as directed and only as much as you need.  Corticosteroid injections may be given if your caregiver thinks they are needed. There is a limited number of times these injections may be given. COLD THERAPY   Cold treatment (icing) should be applied for 10 to 15 minutes every 2 to 3 hours for inflammation and pain, and immediately after activity that aggravates your symptoms. Use ice packs or an ice massage. SEEK MEDICAL CARE IF:   Symptoms get worse or do not improve in 2 to 4 weeks, despite treatment.  You experience pain, numbness, or coldness in the hand.  Blue, gray, or dark color appears in the fingernails.  Any of the following occur after surgery: increased pain, swelling, redness, drainage of fluids, bleeding in the affected area, or signs of infection.  New, unexplained symptoms develop. (Drugs used in treatment may produce side effects.) Document Released: 07/05/2005 Document Revised: 09/27/2011 Document Reviewed: 10/17/2008 ExitCare Patient Information 2015 [employer]. This information is not intended to replace advice given to you by your health care provider. Make sure you discuss any questions you have with your health care provider.   

## 2014-05-19 NOTE — Progress Notes (Signed)
[facility] [address], [place] [address] [phone]  Date:  05/19/2014   Name:  Julia Romero   DOB:  [birth year]   MRN:  [id number]  PCP:  Wyatt Haste, MD    Chief Complaint: Wrist Pain   History of Present Illness:  Julia Romero is a 53 y.o. very pleasant female patient who presents with the following:  Working in the yard yesterday pulling weeds.  Later in the day she developed pain in the radial aspect of the wrist No direct injury by history. No history of inflammatory joint disease or connective tissue disease No fever or chills, redness or swelling. No improvement with over the counter medications or other home remedies. Denies other complaint or health concern today.   Patient Active Problem List   Diagnosis Date Noted  . Allergic rhinitis 10/01/2011  . Herpes labialis 10/01/2011    Past Medical History  Diagnosis Date  . Allergy     RHINITIS  . Herpes labialis   . Contraception management     sees gynecology    Past Surgical History  Procedure Laterality Date  . Tonsillectomy and adenoidectomy  1970  . Tubes in ears  1973  . Wisdom tooth extraction  1986    History  Substance Use Topics  . Smoking status: Never Smoker   . Smokeless tobacco: Never Used  . Alcohol Use: 5.0 oz/week    10 drink(s) per week    Family History  Problem Relation Age of Onset  . Hypertension Mother   . Diabetes Maternal Aunt   . Cancer Maternal Uncle   . Cancer Paternal Aunt   . Diabetes Paternal Aunt   . Arthritis Maternal Grandmother   . Heart disease Neg Hx   . Stroke Neg Hx   . COPD Neg Hx   . Colon cancer Neg Hx     No Known Allergies  Medication list has been reviewed and updated.  Current Outpatient Prescriptions on File Prior to Visit  Medication Sig Dispense Refill  . Acyclovir-Hydrocortisone (XERESE) 5-1 % CREA APPLY TO AFFECTED AREAS 5 TIMES DAILY FOR 5 DAYS 5 g 11  . azelastine (ASTELIN) 0.1 % nasal spray  Place 1 spray into both nostrils 2 (two) times daily. 30 mL 11  . fluticasone (FLONASE) 50 MCG/ACT nasal spray Place 1 spray into both nostrils daily. 16 g 11  . levonorgestrel-ethinyl estradiol (NORDETTE) 0.15-30 MG-MCG tablet Take 1 tablet by mouth daily.     No current facility-administered medications on file prior to visit.    Review of Systems:  As per HPI, otherwise negative.    Physical Examination: Filed Vitals:   05/19/14 1017  BP: 118/78  Pulse: 79  Temp: 98.3 F (36.8 C)  Resp: 16   Filed Vitals:   05/19/14 1017  Height: 5\' 7"  (1.702 m)  Weight: 194 lb (87.998 kg)   Body mass index is 30.38 kg/(m^2). Ideal Body Weight: Weight in (lb) to have BMI = 25: 159.3   GEN: WDWN, NAD, Non-toxic, Alert & Oriented x 3 HEENT: Atraumatic, Normocephalic.  Ears and Nose: No external deformity. EXTR: No clubbing/cyanosis/edema NEURO: Normal gait.  PSYCH: Normally interactive. Conversant. Not depressed or anxious appearing.  Calm demeanor.  RIGHT wrist:  No tenderness, ecchymosis or swelling.  Finklestein's positive  Assessment and Plan: De Quervains tenosynovitis Anaprox Splint  Follow up 1 week   Signed,  Ellison Carwin, MD   [facility] reading (PRIMARY) by  Dr. Ouida Sills. negative.

## 2014-06-05 ENCOUNTER — Telehealth: Payer: Self-pay | Admitting: Family Medicine

## 2014-06-05 NOTE — Telephone Encounter (Signed)
Talked with pt and advised her Dr.Lalonde did not know of anything cheaper

## 2014-06-05 NOTE — Telephone Encounter (Signed)
I don't know of anything less expensive

## 2014-06-05 NOTE — Telephone Encounter (Signed)
Pt called and stated that when she went to pick up medication, xerese, it had a 35 dollar copay. She wanted to know if there was anything less expensive. [facility] [address]

## 2015-07-07 ENCOUNTER — Encounter: Payer: Self-pay | Admitting: Family Medicine

## 2015-07-07 ENCOUNTER — Ambulatory Visit (INDEPENDENT_AMBULATORY_CARE_PROVIDER_SITE_OTHER): Payer: 59 | Admitting: Family Medicine

## 2015-07-07 VITALS — BP 128/80 | HR 80 | Resp 14 | Wt 205.2 lb

## 2015-07-07 DIAGNOSIS — N649 Disorder of breast, unspecified: Secondary | ICD-10-CM | POA: Diagnosis not present

## 2015-07-07 DIAGNOSIS — Z23 Encounter for immunization: Secondary | ICD-10-CM | POA: Diagnosis not present

## 2015-07-07 DIAGNOSIS — Z86010 Personal history of colonic polyps: Secondary | ICD-10-CM | POA: Diagnosis not present

## 2015-07-07 DIAGNOSIS — B001 Herpesviral vesicular dermatitis: Secondary | ICD-10-CM

## 2015-07-07 DIAGNOSIS — Z79899 Other long term (current) drug therapy: Secondary | ICD-10-CM

## 2015-07-07 DIAGNOSIS — J301 Allergic rhinitis due to pollen: Secondary | ICD-10-CM | POA: Insufficient documentation

## 2015-07-07 DIAGNOSIS — L988 Other specified disorders of the skin and subcutaneous tissue: Secondary | ICD-10-CM

## 2015-07-07 LAB — CBC WITH DIFFERENTIAL/PLATELET
Basophils Absolute: 0 10*3/uL (ref 0.0–0.1)
Basophils Relative: 0 % (ref 0–1)
Eosinophils Absolute: 0.1 10*3/uL (ref 0.0–0.7)
Eosinophils Relative: 1 % (ref 0–5)
HCT: 44.3 % (ref 36.0–46.0)
Hemoglobin: 14.7 g/dL (ref 12.0–15.0)
LYMPHS ABS: 2.2 10*3/uL (ref 0.7–4.0)
Lymphocytes Relative: 29 % (ref 12–46)
MCH: 30.1 pg (ref 26.0–34.0)
MCHC: 33.2 g/dL (ref 30.0–36.0)
MCV: 90.6 fL (ref 78.0–100.0)
MPV: 10.7 fL (ref 8.6–12.4)
Monocytes Absolute: 0.5 10*3/uL (ref 0.1–1.0)
Monocytes Relative: 7 % (ref 3–12)
NEUTROS PCT: 63 % (ref 43–77)
Neutro Abs: 4.9 10*3/uL (ref 1.7–7.7)
PLATELETS: 288 10*3/uL (ref 150–400)
RBC: 4.89 MIL/uL (ref 3.87–5.11)
RDW: 13.3 % (ref 11.5–15.5)
WBC: 7.7 10*3/uL (ref 4.0–10.5)

## 2015-07-07 LAB — LIPID PANEL
CHOLESTEROL: 209 mg/dL — AB (ref 125–200)
HDL: 46 mg/dL (ref >=46)
LDL Cholesterol: 142 mg/dL — ABNORMAL HIGH (ref <130)
TRIGLYCERIDES: 105 mg/dL (ref <150)
Total CHOL/HDL Ratio: 4.5 Ratio (ref <=5.0)
VLDL: 21 mg/dL (ref <30)

## 2015-07-07 LAB — COMPREHENSIVE METABOLIC PANEL
ALBUMIN: 3.9 g/dL (ref 3.6–5.1)
ALT: 27 U/L (ref 6–29)
AST: 33 U/L (ref 10–35)
Alkaline Phosphatase: 42 U/L (ref 33–130)
BUN: 11 mg/dL (ref 7–25)
CHLORIDE: 103 mmol/L (ref 98–110)
CO2: 24 mmol/L (ref 20–31)
Calcium: 9.2 mg/dL (ref 8.6–10.4)
Creat: 0.79 mg/dL (ref 0.50–1.05)
Glucose, Bld: 91 mg/dL (ref 65–99)
POTASSIUM: 4 mmol/L (ref 3.5–5.3)
Sodium: 136 mmol/L (ref 135–146)
TOTAL PROTEIN: 6.9 g/dL (ref 6.1–8.1)
Total Bilirubin: 0.5 mg/dL (ref 0.2–1.2)

## 2015-07-07 MED ORDER — AZELASTINE HCL 0.1 % NA SOLN: 1.0000 | Freq: Two times a day (BID) | NASAL | Status: DC

## 2015-07-07 MED ORDER — VALACYCLOVIR HCL 1 G PO TABS: 1000.0000 mg | ORAL_TABLET | Freq: Every day | ORAL | Status: DC

## 2015-07-07 MED ORDER — ACYCLOVIR-HYDROCORTISONE 5-1 % EX CREA: TOPICAL_CREAM | CUTANEOUS | Status: DC

## 2015-07-07 NOTE — Progress Notes (Signed)
   Subjective:    Patient ID: Julia Romero, female    DOB: 08/13/1960, 54 y.o.   MRN: [id number]  HPI She is here a medication management visit. She does have a history of herpes labialis and is using topical medication. She describes having as many as one per month. She does have underlying allergies and is using Astelin with good results.She also has history of colonic polypsand did have a colonoscopy in 2014. She is scheduled for another one in 2019. She has a lesion present on her left breast that she thinks is growing. Review his record indicates no recent routine labs have been drawn.  Review of Systems     Objective:   Physical Exam Alert and in no distress. Tympanic membranes and canals are normal. Pharyngeal area is normal. Neck is supple without adenopathy or thyromegaly. Cardiac exam shows a regular sinus rhythm without murmurs or gallops. Lungs are clear to auscultation. Exam of the left breast does show a round well demarcated pigmented lesion to the medial aspect of the breast.       Assessment & Plan:  Herpes labialis - Plan: valACYclovir (VALTREX) 1000 MG tablet, Acyclovir-Hydrocortisone (XERESE) 5-1 % CREA  Non-seasonal allergic rhinitis due to pollen - Plan: azelastine (ASTELIN) 0.1 % nasal spray  Need for prophylactic vaccination and inoculation against influenza - Plan: Flu Vaccine QUAD 36+ mos IM  History of colonic polyps - Plan: CBC with Differential/Platelet, Comprehensive metabolic panel  Encounter for long-term (current) use of medications - Plan: CBC with Differential/Platelet, Comprehensive metabolic panel, Lipid panel  Lesion of skin of breast Recommend she return here for lunch biopsy of the skin lesion however it really does appear benign.She will start on Valtrex to suppress the herpes labialis. Her immunizations were updated.The topical medication was also prescribed.

## 2015-07-09 ENCOUNTER — Other Ambulatory Visit: Payer: Self-pay | Admitting: Family Medicine

## 2015-07-29 ENCOUNTER — Ambulatory Visit (INDEPENDENT_AMBULATORY_CARE_PROVIDER_SITE_OTHER): Payer: 59 | Admitting: Family Medicine

## 2015-07-29 ENCOUNTER — Encounter: Payer: Self-pay | Admitting: Family Medicine

## 2015-07-29 VITALS — BP 148/98 | HR 78 | Ht 67.0 in | Wt 206.0 lb

## 2015-07-29 DIAGNOSIS — N390 Urinary tract infection, site not specified: Secondary | ICD-10-CM

## 2015-07-29 DIAGNOSIS — R350 Frequency of micturition: Secondary | ICD-10-CM | POA: Diagnosis not present

## 2015-07-29 DIAGNOSIS — IMO0001 Reserved for inherently not codable concepts without codable children: Secondary | ICD-10-CM

## 2015-07-29 LAB — POCT URINALYSIS DIPSTICK
BILIRUBIN UA: NEGATIVE
Glucose, UA: NEGATIVE
NITRITE UA: NEGATIVE
PH UA: 6
PROTEIN UA: NEGATIVE
Spec Grav, UA: >=1.03
Urobilinogen, UA: NEGATIVE

## 2015-07-29 MED ORDER — SULFAMETHOXAZOLE-TRIMETHOPRIM 800-160 MG PO TABS: 1.0000 | ORAL_TABLET | Freq: Two times a day (BID) | ORAL | Status: DC

## 2015-07-29 NOTE — Progress Notes (Signed)
   Subjective:    Patient ID: Julia Romero, female    DOB: 1960-12-27, 55 y.o.   MRN: [id number]  HPI She complains of a several day history of frequency and urgency but no dysuria, fever, chills, abdominal pain.   Review of Systems     Objective:   Physical Exam Alert and in no distress. Urine dipstick was positive.      Assessment & Plan:  Frequency - Plan: POCT urinalysis dipstick, sulfamethoxazole-trimethoprim (BACTRIM DS,SEPTRA DS) 800-160 MG tablet  Acute UTI - Plan: sulfamethoxazole-trimethoprim (BACTRIM DS,SEPTRA DS) 800-160 MG tablet I will treat her with a three-day course. She will call and let me know how she is doing.

## 2015-07-29 NOTE — Patient Instructions (Signed)
Azostandard can help with your symptoms but will make your urine turn orangeish yellow

## 2015-08-04 ENCOUNTER — Telehealth: Payer: Self-pay

## 2015-08-04 DIAGNOSIS — N390 Urinary tract infection, site not specified: Secondary | ICD-10-CM

## 2015-08-04 DIAGNOSIS — IMO0001 Reserved for inherently not codable concepts without codable children: Secondary | ICD-10-CM

## 2015-08-04 MED ORDER — SULFAMETHOXAZOLE-TRIMETHOPRIM 800-160 MG PO TABS: 1.0000 | ORAL_TABLET | Freq: Two times a day (BID) | ORAL | Status: DC

## 2015-08-04 NOTE — Telephone Encounter (Signed)
Pt was seen last week and has called back to let you know she still feels the urge to urinate often and would [facility] [address] [facility] [place]

## 2015-08-04 NOTE — Telephone Encounter (Signed)
Let her know that I called it in 

## 2015-08-04 NOTE — Telephone Encounter (Signed)
Called patient and advised of more abx called to her pharmacy.

## 2015-08-06 IMAGING — CR DG WRIST COMPLETE 3+V*R*
4 series · 4 of 4 positions shown · non-contrast
Comparison: None.

CLINICAL DATA: Wrist pain.

EXAM:
RIGHT WRIST - COMPLETE 3+ VIEW

[PA]
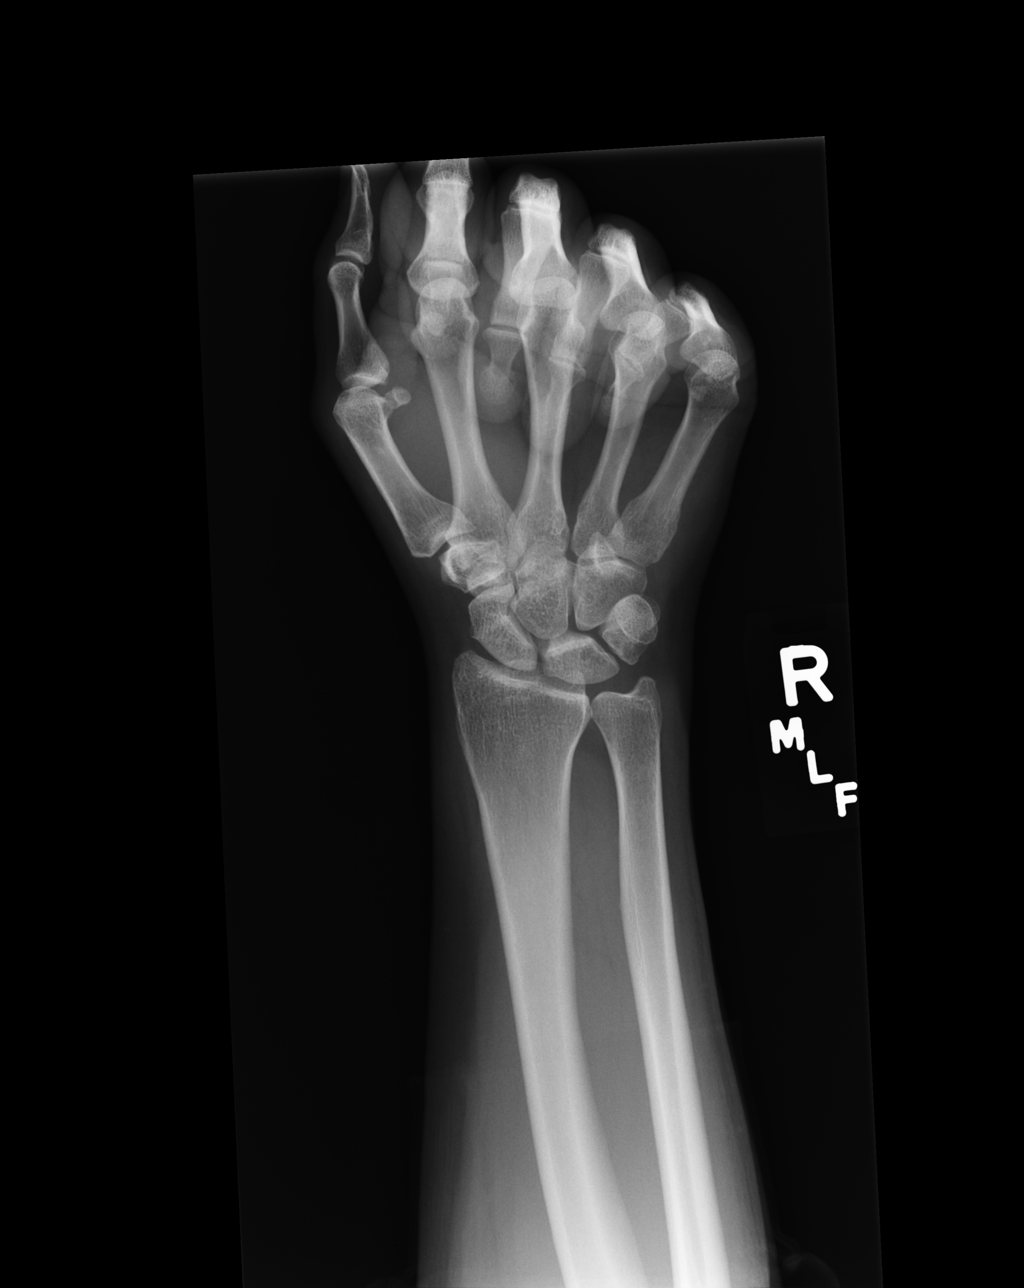

[pa int rot]
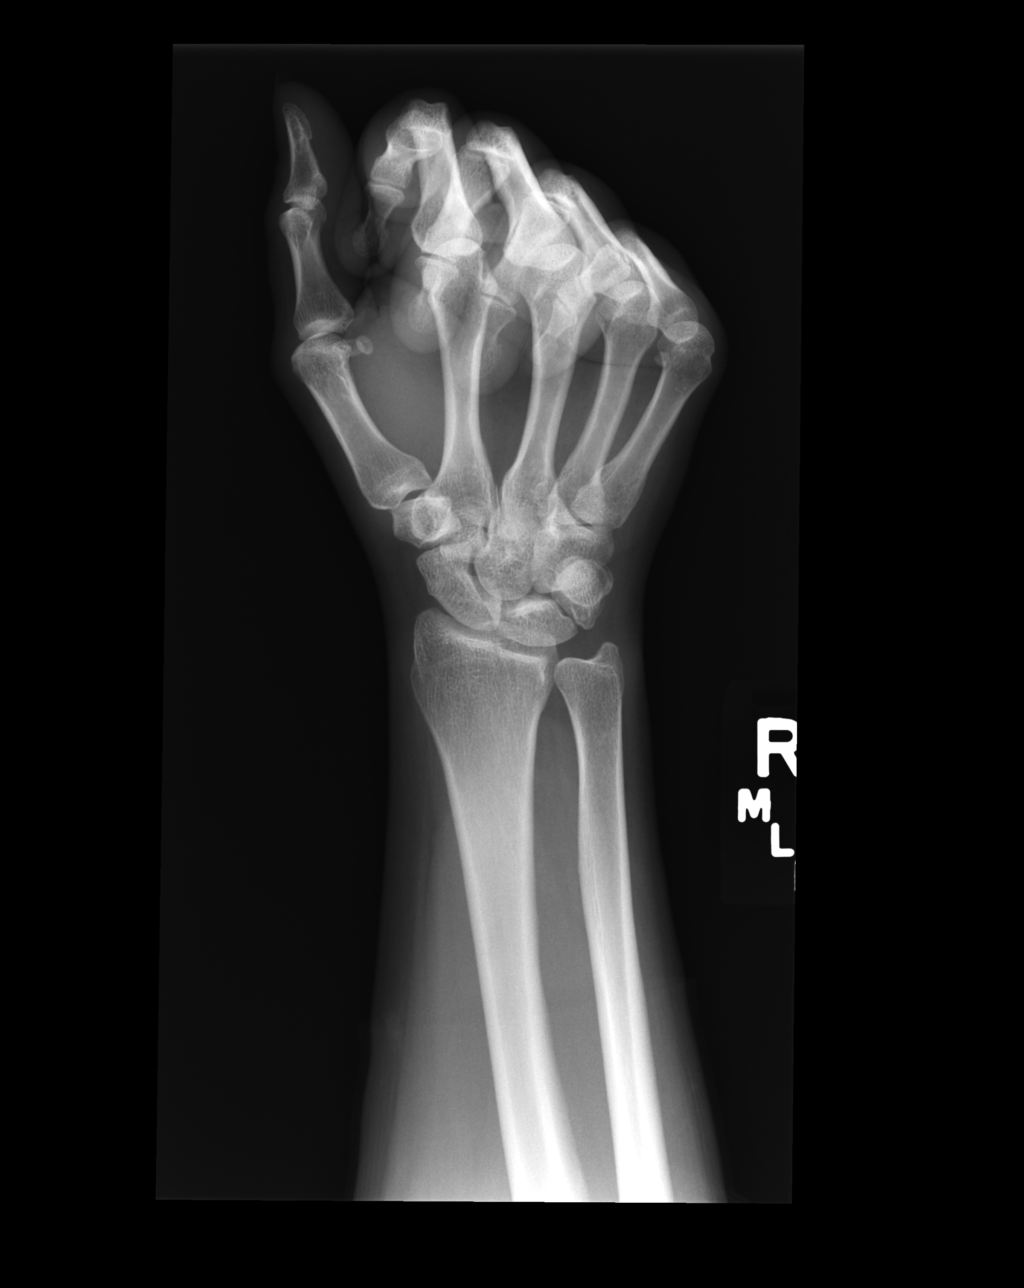

[lateral]
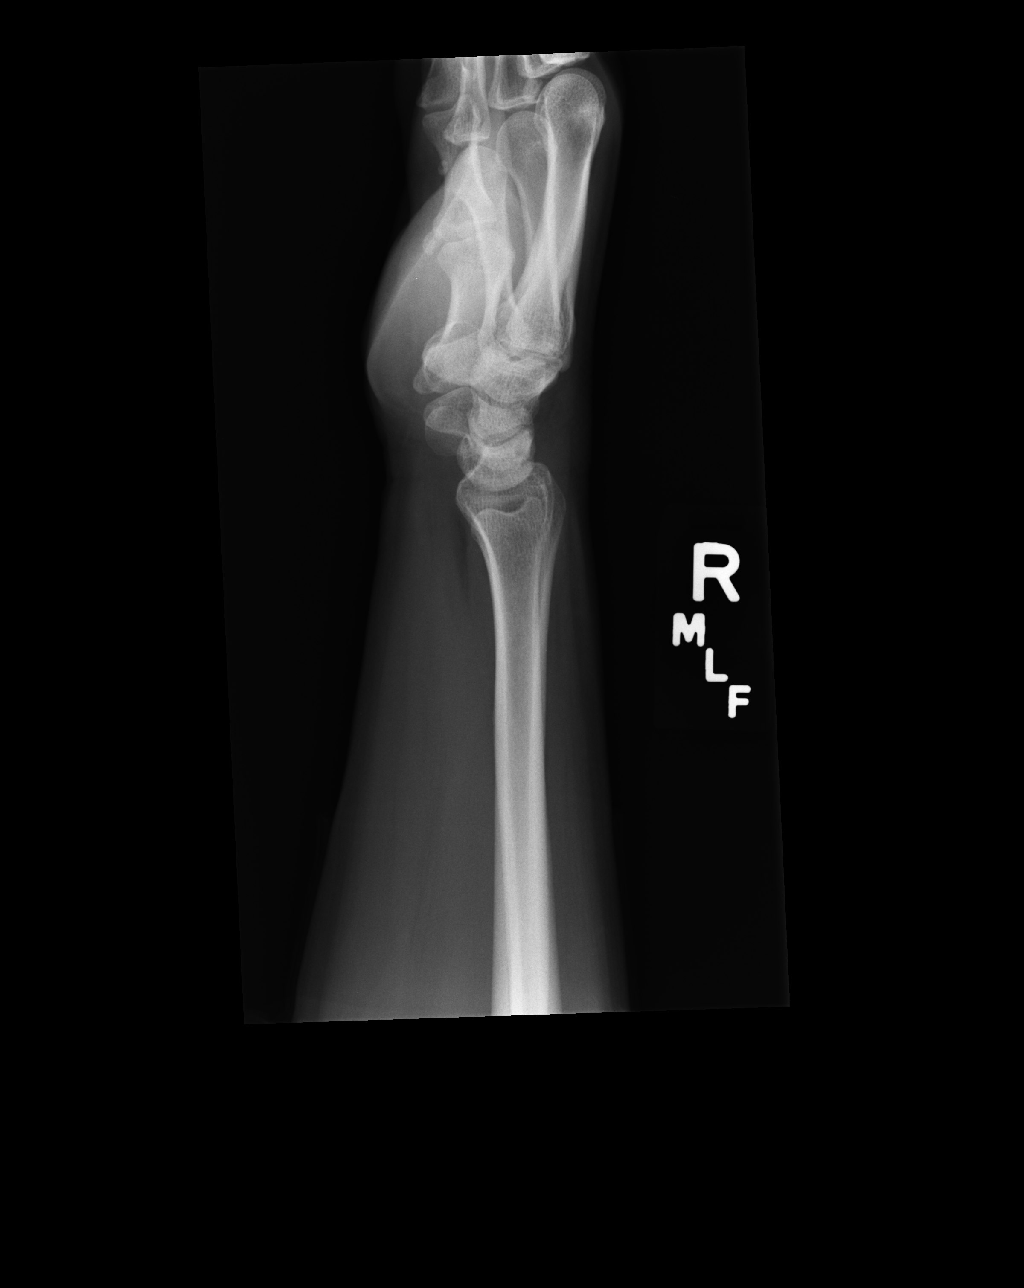

[pa navicular]
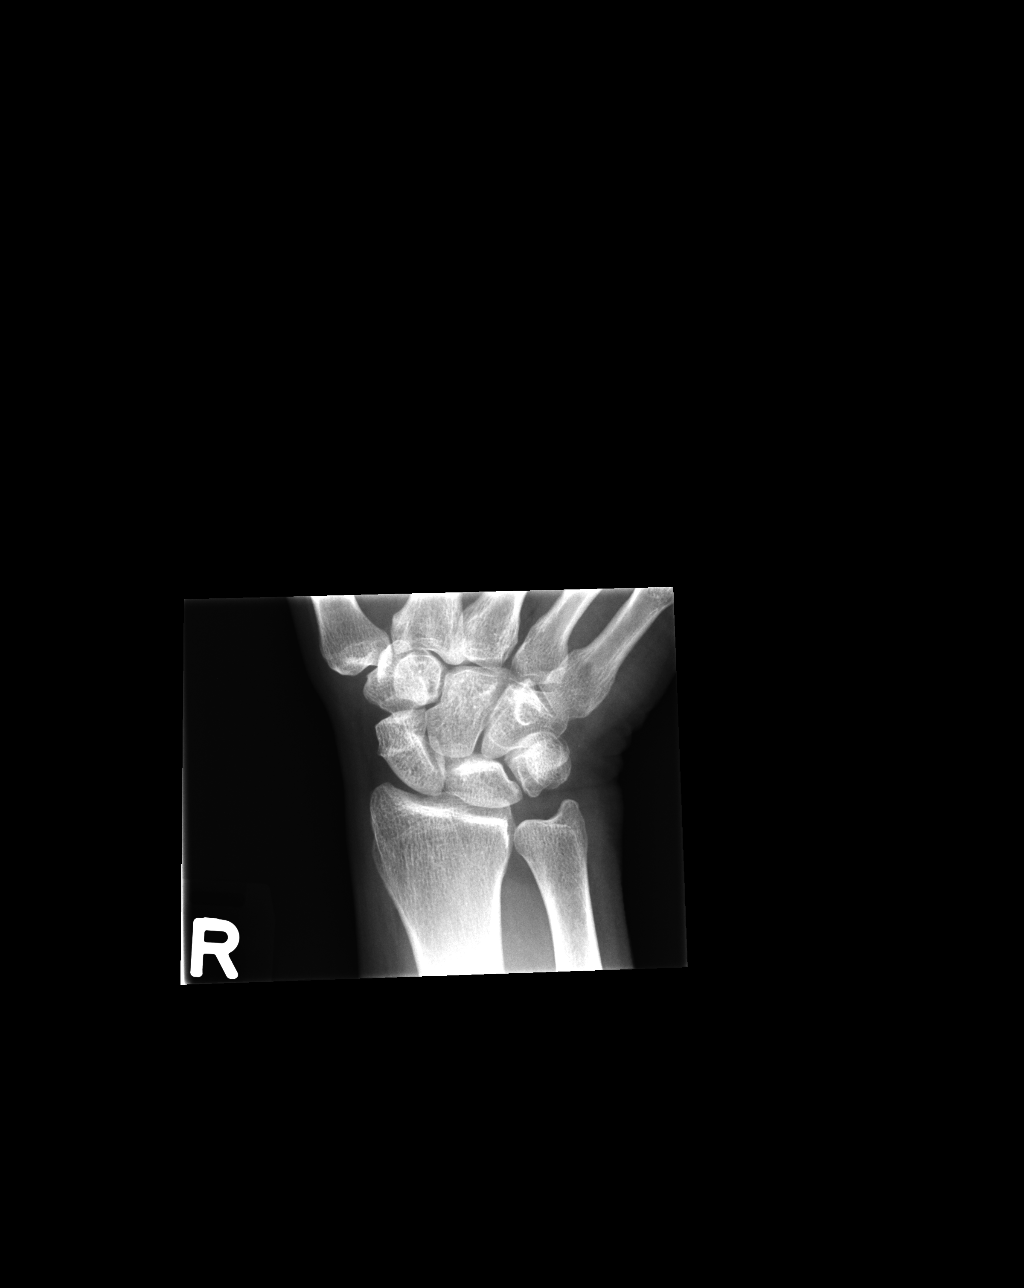

[4 of 4 positions shown; findings below may reference images not displayed]

FINDINGS: There is no evidence of fracture or dislocation. There is no
evidence of arthropathy or other focal bone abnormality. Soft
tissues are unremarkable.
IMPRESSION: No acute abnormality.

## 2015-09-09 LAB — HM PAP SMEAR

## 2015-09-09 LAB — RESULTS CONSOLE HPV: CHL HPV: NEGATIVE

## 2015-11-10 ENCOUNTER — Ambulatory Visit (INDEPENDENT_AMBULATORY_CARE_PROVIDER_SITE_OTHER): Payer: 59 | Admitting: Family Medicine

## 2015-11-10 VITALS — BP 160/100 | HR 78 | Wt 202.6 lb

## 2015-11-10 DIAGNOSIS — R42 Dizziness and giddiness: Secondary | ICD-10-CM

## 2015-11-10 NOTE — Progress Notes (Signed)
   Subjective:    Patient ID: Julia Romero, female    DOB: 1960-09-23, 55 y.o.   MRN: [id number]  HPI She is here for evaluation of dizzy spells that started last Saturday. She woke up and as soon as she got out of bed became quite dizzy. This lasted for several seconds and then went away but did notice with any motion, she would get dizzy again. There was no associated blurred vision, double vision, weakness. She has had no recent illnesses. No sore throat earache or cough. She has had no changes in her medications. She cannot relate this to any particular head position or body position.   Review of Systems     Objective:   Physical Exam Alert and in no distress. EOMI. Cerebellar testing normal. DTRs normal.Tympanic membranes and canals are normal. Pharyngeal area is normal. Neck is supple without adenopathy or thyromegaly. Cardiac exam shows a regular sinus rhythm without murmurs or gallops. Lungs are clear to auscultation.        Assessment & Plan:  Vertigo I explained since she is getting better, further evaluation and intervention is not necessary. Discussed placing her on Antivert however will hold off on this as long she continues to get better. She was comfortable with this.

## 2016-01-05 ENCOUNTER — Other Ambulatory Visit: Payer: Self-pay | Admitting: Family Medicine

## 2016-02-23 ENCOUNTER — Encounter: Payer: Self-pay | Admitting: Family Medicine

## 2016-02-23 ENCOUNTER — Ambulatory Visit (INDEPENDENT_AMBULATORY_CARE_PROVIDER_SITE_OTHER): Payer: 59 | Admitting: Family Medicine

## 2016-02-23 DIAGNOSIS — H6593 Unspecified nonsuppurative otitis media, bilateral: Secondary | ICD-10-CM | POA: Diagnosis not present

## 2016-02-23 NOTE — Progress Notes (Signed)
   Subjective:    Patient ID: Julia Romero, female    DOB: 1961-04-12, 55 y.o.   MRN: [id number]  HPI She complains of a one-week history that started with left earache then progressing to both no sore throat, fever, chills, cough or  congestion   Review of Systems     Objective:   Physical Exam Alert and in no distress. Tympanic membranes are slightly retracted with minimal fluid behind them. canals are normal. Pharyngeal area is normal. Neck is supple without adenopathy or thyromegaly. Cardiac exam shows a regular sinus rhythm without murmurs or gallops. Lungs are clear to auscultation.        Assessment & Plan:  Bilateral serous otitis media, recurrence not specified, unspecified chronicity Recommended judicious use of a decongestant. Also discussed use of Afrin nasal spray but only for short period of time.

## 2016-06-18 ENCOUNTER — Telehealth: Payer: Self-pay | Admitting: Family Medicine

## 2016-06-18 NOTE — Telephone Encounter (Signed)
Will call back to set up a appt.

## 2016-06-30 ENCOUNTER — Other Ambulatory Visit: Payer: Self-pay | Admitting: Family Medicine

## 2016-06-30 DIAGNOSIS — B001 Herpesviral vesicular dermatitis: Secondary | ICD-10-CM

## 2016-06-30 NOTE — Telephone Encounter (Signed)
Is this okay to refill? 

## 2016-09-16 DIAGNOSIS — N951 Menopausal and female climacteric states: Secondary | ICD-10-CM | POA: Diagnosis not present

## 2016-09-16 DIAGNOSIS — Z01419 Encounter for gynecological examination (general) (routine) without abnormal findings: Secondary | ICD-10-CM | POA: Diagnosis not present

## 2016-09-16 DIAGNOSIS — Z1231 Encounter for screening mammogram for malignant neoplasm of breast: Secondary | ICD-10-CM | POA: Diagnosis not present

## 2016-09-16 LAB — HM MAMMOGRAPHY: HM MAMMO: NORMAL (ref 0–4)

## 2016-09-16 LAB — HM PAP SMEAR: HM PAP: NORMAL

## 2016-10-05 ENCOUNTER — Other Ambulatory Visit: Payer: Self-pay

## 2016-10-05 ENCOUNTER — Telehealth: Payer: Self-pay | Admitting: Family Medicine

## 2016-10-05 MED ORDER — FLUTICASONE PROPIONATE 50 MCG/ACT NA SUSP: NASAL | 0 refills | Status: DC

## 2016-10-05 NOTE — Telephone Encounter (Signed)
I have sent in flonase

## 2016-10-05 NOTE — Telephone Encounter (Signed)
Pt called and stated that she has made a appt for a medcheck but will be out of fluticasone before then. [facility] [address] Pt can be reached at [phone].

## 2016-10-13 ENCOUNTER — Ambulatory Visit (INDEPENDENT_AMBULATORY_CARE_PROVIDER_SITE_OTHER): Payer: 59 | Admitting: Family Medicine

## 2016-10-13 ENCOUNTER — Encounter: Payer: Self-pay | Admitting: Family Medicine

## 2016-10-13 VITALS — BP 140/90 | HR 79 | Wt 202.0 lb

## 2016-10-13 DIAGNOSIS — B001 Herpesviral vesicular dermatitis: Secondary | ICD-10-CM

## 2016-10-13 DIAGNOSIS — Z86010 Personal history of colonic polyps: Secondary | ICD-10-CM | POA: Diagnosis not present

## 2016-10-13 DIAGNOSIS — Z23 Encounter for immunization: Secondary | ICD-10-CM

## 2016-10-13 DIAGNOSIS — R030 Elevated blood-pressure reading, without diagnosis of hypertension: Secondary | ICD-10-CM

## 2016-10-13 DIAGNOSIS — J3089 Other allergic rhinitis: Secondary | ICD-10-CM | POA: Diagnosis not present

## 2016-10-13 MED ORDER — BUDESONIDE 32 MCG/ACT NA SUSP: 2.0000 | Freq: Every day | NASAL | 11 refills | Status: DC

## 2016-10-13 MED ORDER — AZELASTINE HCL 0.1 % NA SOLN: 1.0000 | Freq: Two times a day (BID) | NASAL | 11 refills | Status: DC

## 2016-10-13 MED ORDER — CETIRIZINE HCL 10 MG PO TABS: 10.0000 mg | ORAL_TABLET | Freq: Every day | ORAL | 3 refills | Status: DC

## 2016-10-13 MED ORDER — VALACYCLOVIR HCL 1 G PO TABS: 1000.0000 mg | ORAL_TABLET | Freq: Every day | ORAL | 3 refills | Status: DC

## 2016-10-13 NOTE — Patient Instructions (Signed)
You can add Allegra, Claritin or Zyrtec to your regimen. You can also switch from Flonase to Rhinocort and see if that makes a difference

## 2016-10-13 NOTE — Progress Notes (Signed)
   Subjective:    Patient ID: Julia Romero, female    DOB: Jul 14, 1961, 56 y.o.   MRN: [id number]  HPI He is here for medication management visit. She does have seasonal allergies and presently her medication regimen is not controlling her symptoms. She also has a history of colonic polyps and will be having this checked again in 2019. She takes her Valtrex on a regular basis and has not had any outbreaks in the last year. Her immunizations were reviewed and she does need TDaP. She follows up regularly with gynecology and recently has stopped taking her birth control pills she has had difficulty with hot flashes. She does not smoke.   Review of Systems     Objective:   Physical Exam Alert and in no distress. Tympanic membranes and canals are normal. Pharyngeal area is normal. Neck is supple without adenopathy or thyromegaly. Cardiac exam shows a regular sinus rhythm without murmurs or gallops. Lungs are clear to auscultation.        Assessment & Plan:  Need for Tdap vaccination - Plan: Tdap vaccine greater than or equal to 7yo IM  Chronic nonseasonal allergic rhinitis due to pollen - Plan: azelastine (ASTELIN) 0.1 % nasal spray, budesonide (RHINOCORT ALLERGY) 32 MCG/ACT nasal spray, cetirizine (ZYRTEC) 10 MG tablet  History of colonic polyps  Herpes labialis - Plan: valACYclovir (VALTREX) 1000 MG tablet  Elevated blood pressure reading Apparently her insurance will pay for OTC meds. I will therefore switch her to Zyrtec, Rhinocort and continue the Astelin. Also continue Valtrex as she is doing well on this. Her immunizations were updated. I will monitor the blood pressure.

## 2016-10-22 ENCOUNTER — Other Ambulatory Visit: Payer: Self-pay

## 2016-10-22 ENCOUNTER — Telehealth: Payer: Self-pay | Admitting: Family Medicine

## 2016-10-22 DIAGNOSIS — B001 Herpesviral vesicular dermatitis: Secondary | ICD-10-CM

## 2016-10-22 DIAGNOSIS — J3089 Other allergic rhinitis: Secondary | ICD-10-CM

## 2016-10-22 MED ORDER — AZELASTINE HCL 0.1 % NA SOLN: 1.0000 | Freq: Two times a day (BID) | NASAL | 3 refills | Status: DC

## 2016-10-22 MED ORDER — VALACYCLOVIR HCL 1 G PO TABS: 1000.0000 mg | ORAL_TABLET | Freq: Every day | ORAL | 3 refills | Status: DC

## 2016-10-22 MED ORDER — BUDESONIDE 32 MCG/ACT NA SUSP: 2.0000 | Freq: Every day | NASAL | 3 refills | Status: DC

## 2016-10-22 NOTE — Telephone Encounter (Signed)
[facility]

## 2016-10-22 NOTE — Telephone Encounter (Signed)
Faxed request for azelastine, budesonide and valacyclovir to be called to [facility].   Thanks, RLB

## 2016-10-22 NOTE — Telephone Encounter (Signed)
Pt called and stated that she is changing her pharmacy. She is switching to [facility]. You should be receiving refill request from them shortly.

## 2016-10-27 ENCOUNTER — Telehealth: Payer: Self-pay

## 2016-10-27 NOTE — Telephone Encounter (Signed)
Faxed request for cetirizine 90 day supply to [facility]. Julia Romero

## 2016-11-22 ENCOUNTER — Telehealth: Payer: Self-pay

## 2016-11-22 MED ORDER — FLUTICASONE PROPIONATE 50 MCG/ACT NA SUSP: NASAL | 11 refills | Status: DC

## 2016-11-22 NOTE — Telephone Encounter (Signed)
Pt called and request a change in nasal spray from budesonide to fluticasone. She reports ins is not covering budesonide. [phone].

## 2016-12-29 ENCOUNTER — Encounter: Payer: Self-pay | Admitting: Family Medicine

## 2016-12-29 ENCOUNTER — Ambulatory Visit (INDEPENDENT_AMBULATORY_CARE_PROVIDER_SITE_OTHER): Payer: 59 | Admitting: Family Medicine

## 2016-12-29 VITALS — BP 120/80 | HR 72 | Temp 98.2°F | Resp 16 | Wt 196.8 lb

## 2016-12-29 DIAGNOSIS — H6692 Otitis media, unspecified, left ear: Secondary | ICD-10-CM | POA: Diagnosis not present

## 2016-12-29 DIAGNOSIS — J0100 Acute maxillary sinusitis, unspecified: Secondary | ICD-10-CM | POA: Diagnosis not present

## 2016-12-29 MED ORDER — AMOXICILLIN 875 MG PO TABS: 875.0000 mg | ORAL_TABLET | Freq: Two times a day (BID) | ORAL | 0 refills | Status: DC

## 2016-12-29 NOTE — Progress Notes (Signed)
Subjective:  Julia Romero is a 56 y.o. female who presents for possible sinus infection.  Symptoms include a 3 day history of nasal congestion, sinus pain and pressure, hoarseness, left ear pain, sore throat and mild cough that just started today.  Denies fever, chills, body aches, chest pain, shortness of breath, abdominal pain, N/V/D, LE edema.   Past history is significant for no history of pneumonia or bronchitis. Patient is a non-smoker.  Using Mucinex for symptoms.  Denies sick contacts.  No other aggravating or relieving factors.  No other c/o.  History of allergies and is treating her allergies with daily zyrtec, flonase.  No recent antibiotics.   ROS as in subjective   Objective: Vitals:   12/29/16 0853  BP: 120/80  Pulse: 72  Resp: 16  Temp: 98.2 F (36.8 C)    General appearance: Alert, WD/WN, no distress                             Skin: warm, no rash                           Head: + maxillary L>Rsinus tenderness,                            Eyes: conjunctiva normal, corneas clear, PERRLA                            Ears: Left TM retracted, dull and erythematous, right TM pearly and normal, external ear canals normal                          Nose: septum midline, turbinates swollen, with erythema and clear discharge             Mouth/throat: MMM, tongue normal, mild pharyngeal erythema, no edema or exudate                           Neck: supple, no adenopathy, no thyromegaly, nontender                          Heart: RRR, normal S1, S2, no murmurs                         Lungs: CTA bilaterally, no wheezes, rales, or rhonchi       Assessment and Plan: Acute otitis media, left - Plan: amoxicillin (AMOXIL) 875 MG tablet  Acute non-recurrent maxillary sinusitis - Plan: amoxicillin (AMOXIL) 875 MG tablet   Prescription sent for Amoxicillin.  Continue with allergy medications. Increase water intake. Can use OTC Mucinex for congestion.  Tylenol or Ibuprofen OTC for fever  and malaise.  Discussed symptomatic relief, nasal saline flush, and call or return if worse or not back to baseline after completing the antibiotic.

## 2016-12-29 NOTE — Patient Instructions (Signed)
Take the antibiotic until finished and if you are not back to baseline let me know.   Increase your water intake, continue treating your allergies, take Mucinex for congestion and cough, tylenol or ibuprofen for fever or pain.

## 2017-01-03 ENCOUNTER — Telehealth: Payer: Self-pay | Admitting: Family Medicine

## 2017-01-03 ENCOUNTER — Other Ambulatory Visit: Payer: Self-pay | Admitting: Family Medicine

## 2017-01-03 MED ORDER — AMOXICILLIN-POT CLAVULANATE 875-125 MG PO TABS: 1.0000 | ORAL_TABLET | Freq: Two times a day (BID) | ORAL | 0 refills | Status: DC

## 2017-01-03 NOTE — Telephone Encounter (Signed)
Please call Patient called would like more aggressive Rx  She is still having issues with ears and sinuses She states she has seem a little improvement. Ear doesn't hurt anymore but now both ears fee like she is in a tunnel, can't here Not able to sleep  She is also taking mucinex dm and her allergy meds    [facility]

## 2017-01-03 NOTE — Telephone Encounter (Signed)
Please find out what percentage she is better and we will call her in a new antibiotic. If she is not back to baseline after completing it then she will need to be seen.

## 2017-01-03 NOTE — Telephone Encounter (Signed)
Left message for pt to call back  °

## 2017-01-03 NOTE — Telephone Encounter (Signed)
Pt was aware to come back in if not better after this round

## 2017-01-03 NOTE — Telephone Encounter (Signed)
Pt states she is 20% better than she was. Her ears don't hurt but feel like they are filled with fluid

## 2017-01-04 ENCOUNTER — Other Ambulatory Visit: Payer: Self-pay | Admitting: Family Medicine

## 2017-01-04 MED ORDER — AMOXICILLIN-POT CLAVULANATE 875-125 MG PO TABS: 1.0000 | ORAL_TABLET | Freq: Two times a day (BID) | ORAL | 0 refills | Status: DC

## 2017-01-14 ENCOUNTER — Telehealth: Payer: Self-pay | Admitting: Family Medicine

## 2017-01-14 NOTE — Telephone Encounter (Signed)
Pt called and stated that she is still having issues. She states she is much better but still has a stopped up ear. She states she can't hear anything out of it. She took her last antibiotic this am.  Pt stated that she was to make an appt if she had continued difficulty. Unfortunately due to only 2 providers today none of the appt times available worked with her schedule today. She stated she would just go to a walk in/urgent care or something. I told her to let me put a note back to [facility] and see what her advise is. Pt can be reached at [phone] and [facility] [address]

## 2017-01-14 NOTE — Telephone Encounter (Signed)
Pt states she is 85% better but still feels like fluid is in her ear and she can't hear. Pt will try a decongestant like sudafed first and see if that works for her. She is drinking plenty of fluids. No ear pain, no fever, some phelgm. But is much better than before. Pt was advised if not working to give [place] a call back Monday or Tuesday but if it gets worse over weekend, go to urgent care

## 2017-01-14 NOTE — Telephone Encounter (Signed)
If her only bothersome symptom is that her ear feels stopped up then have her try a decongestant like sudafed (if she can tolerate this) and make sure she is drinking plenty of water. Is she having any other concerns? Ear pain? Fever? What percentage better is she overall?

## 2017-01-17 ENCOUNTER — Ambulatory Visit (INDEPENDENT_AMBULATORY_CARE_PROVIDER_SITE_OTHER): Payer: 59 | Admitting: Family Medicine

## 2017-01-17 ENCOUNTER — Encounter: Payer: Self-pay | Admitting: Family Medicine

## 2017-01-17 VITALS — BP 120/80 | HR 80 | Wt 199.0 lb

## 2017-01-17 DIAGNOSIS — H6693 Otitis media, unspecified, bilateral: Secondary | ICD-10-CM

## 2017-01-17 MED ORDER — AMOXICILLIN-POT CLAVULANATE 875-125 MG PO TABS: 1.0000 | ORAL_TABLET | Freq: Two times a day (BID) | ORAL | 0 refills | Status: DC

## 2017-01-17 NOTE — Progress Notes (Signed)
   Subjective:    Patient ID: Julia Romero, female    DOB: 14-Nov-1960, 56 y.o.   MRN: [id number]  HPI He is here for recheck. She was treated for sinusitis as well as ear infection placed initially on amoxicillin and switch to Augmentin. She is doing better but still notes difficulty hearing and a popping sensation in both ears. She has a previous history of having tubes placed and as a child.   Review of Systems     Objective:   Physical Exam Alert and in no distress. Left tympanic membrane is retracted with poor landmarks although it malleus was noted. Right tympanic membrane is also retracted especially in the inferior posterior area.       Assessment & Plan:  Bilateral otitis media, unspecified otitis media type - Plan: amoxicillin-clavulanate (AUGMENTIN) 875-125 MG tablet Continue on Augmentin and recheck here in 2 weeks. If any question, referral to ENT will be made.

## 2017-01-31 ENCOUNTER — Ambulatory Visit (INDEPENDENT_AMBULATORY_CARE_PROVIDER_SITE_OTHER): Payer: 59 | Admitting: Family Medicine

## 2017-01-31 ENCOUNTER — Encounter: Payer: Self-pay | Admitting: Family Medicine

## 2017-01-31 VITALS — BP 118/80 | HR 80 | Wt 200.0 lb

## 2017-01-31 DIAGNOSIS — H6693 Otitis media, unspecified, bilateral: Secondary | ICD-10-CM | POA: Diagnosis not present

## 2017-01-31 NOTE — Progress Notes (Signed)
   Subjective:    Patient ID: Julia Romero, female    DOB: 06-05-1961, 56 y.o.   MRN: [id number]  HPI She is here for recheck. She notes that yesterday she started to hear better. No fever, chills, headache.   Review of Systems     Objective:   Physical Exam Alert and in no distress. The left tympanic membrane does show better landmarks but posteriorly there is still some whitish coloration and slight retraction. Right tympanic membrane again show some posterior retraction and whitish coloration.       Assessment & Plan:  Bilateral otitis media, unspecified otitis media type She has been on several courses of antibiotics so I don't think I'll place her on another one but do want to recheck this in 2 weeks and if still showing some abdomen rounded, will refer to ENT.

## 2017-02-03 DIAGNOSIS — N950 Postmenopausal bleeding: Secondary | ICD-10-CM | POA: Diagnosis not present

## 2017-02-16 ENCOUNTER — Ambulatory Visit: Payer: 59 | Admitting: Family Medicine

## 2017-03-30 ENCOUNTER — Telehealth: Payer: Self-pay | Admitting: Family Medicine

## 2017-03-30 MED ORDER — CETIRIZINE HCL 10 MG PO TABS: 10.0000 mg | ORAL_TABLET | Freq: Every day | ORAL | 3 refills | Status: DC

## 2017-03-30 NOTE — Telephone Encounter (Signed)
fax from [place] request for  Cetirizine tab

## 2017-05-17 DIAGNOSIS — I472 Ventricular tachycardia: Secondary | ICD-10-CM | POA: Diagnosis not present

## 2017-06-03 ENCOUNTER — Telehealth: Payer: Self-pay | Admitting: Family Medicine

## 2017-06-03 DIAGNOSIS — J3089 Other allergic rhinitis: Secondary | ICD-10-CM

## 2017-06-03 NOTE — Telephone Encounter (Signed)
Pt called and stated that she needs both of her nasal sprays sent to [facility] for 90 days. She states that [facility] called her and told her that she can receive 90 day supply for the same amount she is paying now. Please send in both nasal sprays to [facility] for 90 days.

## 2017-06-05 MED ORDER — AZELASTINE HCL 0.1 % NA SOLN: 1.0000 | Freq: Two times a day (BID) | NASAL | 3 refills | Status: DC

## 2017-06-05 MED ORDER — FLUTICASONE PROPIONATE 50 MCG/ACT NA SUSP: NASAL | Status: DC

## 2017-06-06 ENCOUNTER — Other Ambulatory Visit: Payer: Self-pay

## 2017-06-06 MED ORDER — FLUTICASONE PROPIONATE 50 MCG/ACT NA SUSP: NASAL | 3 refills | Status: DC

## 2017-07-21 ENCOUNTER — Ambulatory Visit: Payer: 59 | Admitting: Family Medicine

## 2017-07-21 VITALS — BP 152/90 | HR 71 | Resp 18 | Wt 197.8 lb

## 2017-07-21 DIAGNOSIS — R030 Elevated blood-pressure reading, without diagnosis of hypertension: Secondary | ICD-10-CM

## 2017-07-21 NOTE — Progress Notes (Signed)
   Subjective:    Patient ID: Julia Romero, female    DOB: Aug 01, 1960, 57 y.o.   MRN: [id number]  HPI She is here for concern over elevated blood pressure.  Approximately 3 weeks ago she had her blood pressure checked at her dentist office with a reading of 153/107.  She is here for follow-up visit.   Review of Systems     Objective:   Physical Exam Alert and in no distress otherwise not examined.  Review of her previous blood pressures does show essentially normal readings with only occasional elevations.      Assessment & Plan:  Elevated blood pressure reading I explained that she does indeed have an elevated blood pressure but review of the record indicates over the last 3 weeks.  Recommend she return here in 1 month to truly assess her blood pressure readings.  Explained that it needs to be a sustained elevated blood pressure.  Discussed checking it in the resting sitting position with arm at heart level.  Recheck here in 1 month.  She was comfortable with that approach.

## 2017-08-11 ENCOUNTER — Ambulatory Visit: Payer: 59 | Admitting: Physician Assistant

## 2017-08-11 ENCOUNTER — Encounter: Payer: Self-pay | Admitting: Physician Assistant

## 2017-08-11 VITALS — BP 147/97 | HR 74 | Temp 98.0°F | Resp 16 | Ht 67.0 in | Wt 199.0 lb

## 2017-08-11 DIAGNOSIS — M79671 Pain in right foot: Secondary | ICD-10-CM

## 2017-08-11 NOTE — Patient Instructions (Addendum)
  Please perform the stretches we discussed daily.  Ice the ankle area three times per day for 15 minutes, or at least directly after you have performed your exercise.   Please return if you continue to have the pain, and we can image it at that time. You can also take ibuprofen, 600mg  every 8 hours.     IF you received an x-ray today, you will receive an invoice from [facility]. Please contact [facility] at [phone] with questions or concerns regarding your invoice.   IF you received labwork today, you will receive an invoice from [facility]. Please contact [facility] at [phone] with questions or concerns regarding your invoice.   Our billing staff will not be able to assist you with questions regarding bills from these companies.  You will be contacted with the lab results as soon as they are available. The fastest way to get your results is to activate your My Chart account. Instructions are located on the last page of this paperwork. If you have not heard from [place] regarding the results in 2 weeks, please contact this office.

## 2017-08-11 NOTE — Progress Notes (Signed)
[facility]IMARY [facility] AT [place], [place] [address] [phone]  Date:  08/11/2017   Name:  Julia Romero   DOB:  [birth year]   MRN:  [id number]  PCP:  Denita Lung, MD    History of Present Illness:  Julia Romero is a 57 y.o. female patient who presents to PCP with  Chief Complaint  Patient presents with  . Foot Pain    right/ pt states her foot/ankle hurts x monday. pt started workng out.     4 days ago, she attempted to walk, but it was painful.  She has noticed improvement today.  The pain is localized under the lateral area of her ankle bone.  No swelling or discoloration.    She exercised prior to the pain, but notes no trauma, orlling the ankle. She was walking, without jumping.   She notes that she injured this right ankle but she never saw a provider for it, 30 years ago.    Patient Active Problem List   Diagnosis Date Noted  . Non-seasonal allergic rhinitis due to pollen 07/07/2015  . History of colonic polyps 07/07/2015  . Allergic rhinitis 10/01/2011  . Herpes labialis 10/01/2011    Past Medical History:  Diagnosis Date  . Allergy    RHINITIS  . Contraception management    sees gynecology  . Herpes labialis     Past Surgical History:  Procedure Laterality Date  . TONSILLECTOMY AND ADENOIDECTOMY  1970  . tubes in ears  1973  . WISDOM TOOTH EXTRACTION  1986    Social History   Tobacco Use  . Smoking status: Never Smoker  . Smokeless tobacco: Never Used  Substance Use Topics  . Alcohol use: Yes    Alcohol/week: 5.0 oz    Types: 10 Standard drinks or equivalent per week  . Drug use: No    Family History  Problem Relation Age of Onset  . Hypertension Mother   . Arthritis Maternal Grandmother   . Diabetes Maternal Aunt   . Cancer Maternal Uncle   . Cancer Paternal Aunt   . Diabetes Paternal Aunt   . Heart disease Neg Hx   . Stroke Neg Hx   . COPD Neg Hx   . Colon cancer Neg Hx     No Known Allergies  Medication list has been  reviewed and updated.  Current Outpatient Medications on File Prior to Visit  Medication Sig Dispense Refill  . azelastine (ASTELIN) 0.1 % nasal spray Place 1 spray 2 (two) times daily into both nostrils. 90 mL 3  . cetirizine (ZYRTEC) 10 MG tablet Take 1 tablet (10 mg total) by mouth daily. 90 tablet 3  . fluticasone (FLONASE) 50 MCG/ACT nasal spray PLACE 1 SPRAY INTO BOTH NOSTRILS DAILY. 16 g 3  . valACYclovir (VALTREX) 1000 MG tablet Take 1 tablet (1,000 mg total) by mouth daily. 90 tablet 3   No current facility-administered medications on file prior to visit.     ROS ROS otherwise unremarkable unless listed above.  Physical Examination: BP (!) 147/97   Pulse 74   Temp 98 F (36.7 C) (Oral)   Resp 16   Ht 5\' 7"  (1.702 m)   Wt 199 lb (90.3 kg)   SpO2 96%   BMI 31.17 kg/m  Ideal Body Weight: Weight in (lb) to have BMI = 25: 159.3  Physical Exam  Constitutional: She is oriented to person, place, and time. She appears well-developed and well-nourished. No distress.  HENT:  Head: Normocephalic and atraumatic.  Right Ear: External ear normal.  Left Ear: External ear normal.  Eyes: Conjunctivae and EOM are normal. Pupils are equal, round, and reactive to light.  Cardiovascular: Normal rate.  Pulmonary/Chest: Effort normal. No respiratory distress.  Musculoskeletal:       Right ankle: She exhibits normal range of motion and no swelling. Tenderness (very minimal just inferior-anterior to the malleolus). No lateral malleolus and no medial malleolus tenderness found. Achilles tendon normal.       Right foot: There is normal range of motion, no tenderness and no swelling.  Neurological: She is alert and oriented to person, place, and time.  Skin: She is not diaphoretic.  Psychiatric: She has a normal mood and affect. Her behavior is normal.     Assessment and Plan: Julia Romero is a 57 y.o. female who is here today for cc of  Chief Complaint  Patient presents with  . Foot  Pain    right/ pt states her foot/ankle hurts x monday. pt started workng out.  foot pain improved. Advised rice, and bandage with exercise at this time.  She will return if symptoms resurface.  Right foot pain  Ivar Drape, PA-C [facility] 2/5/20195:33 PM

## 2017-08-16 DIAGNOSIS — I472 Ventricular tachycardia: Secondary | ICD-10-CM | POA: Diagnosis not present

## 2017-08-23 ENCOUNTER — Encounter: Payer: Self-pay | Admitting: Physician Assistant

## 2017-09-21 DIAGNOSIS — Z130 Encounter for screening for diseases of the blood and blood-forming organs and certain disorders involving the immune mechanism: Secondary | ICD-10-CM | POA: Diagnosis not present

## 2017-09-21 DIAGNOSIS — Z1389 Encounter for screening for other disorder: Secondary | ICD-10-CM | POA: Diagnosis not present

## 2017-09-21 DIAGNOSIS — Z01419 Encounter for gynecological examination (general) (routine) without abnormal findings: Secondary | ICD-10-CM | POA: Diagnosis not present

## 2017-09-21 DIAGNOSIS — Z1231 Encounter for screening mammogram for malignant neoplasm of breast: Secondary | ICD-10-CM | POA: Diagnosis not present

## 2017-10-04 ENCOUNTER — Encounter: Payer: Self-pay | Admitting: Family Medicine

## 2017-10-04 ENCOUNTER — Ambulatory Visit: Payer: 59 | Admitting: Family Medicine

## 2017-10-04 VITALS — BP 142/94 | HR 82 | Ht 66.0 in | Wt 195.0 lb

## 2017-10-04 DIAGNOSIS — J3089 Other allergic rhinitis: Secondary | ICD-10-CM | POA: Diagnosis not present

## 2017-10-04 DIAGNOSIS — I10 Essential (primary) hypertension: Secondary | ICD-10-CM

## 2017-10-04 MED ORDER — AZELASTINE HCL 0.1 % NA SOLN: 1.0000 | Freq: Two times a day (BID) | NASAL | 3 refills | Status: DC

## 2017-10-04 MED ORDER — FLUTICASONE PROPIONATE 50 MCG/ACT NA SUSP: NASAL | 3 refills | Status: DC

## 2017-10-04 MED ORDER — IRBESARTAN 75 MG PO TABS: 75.0000 mg | ORAL_TABLET | Freq: Every day | ORAL | 0 refills | Status: DC

## 2017-10-04 NOTE — Progress Notes (Signed)
   Subjective:    Patient ID: Julia Romero, female    DOB: Apr 20, 1961, 57 y.o.   MRN: [id number]  HPI She is here for a recheck on her blood pressure.  She did have a recently taken by her gynecologist and it was again elevated.  Today's reading is also elevated.  She also would like her allergy medications renewed.   Review of Systems     Objective:   Physical Exam Alert and in no distress.  Blood pressure is recorded.       Assessment & Plan:  Essential hypertension - Plan: irbesartan (AVAPRO) 75 MG tablet  Chronic nonseasonal allergic rhinitis due to pollen - Plan: azelastine (ASTELIN) 0.1 % nasal spray, fluticasone (FLONASE) 50 MCG/ACT nasal spray  I discussed the treatment of hypertension with her.  I will place her on Avapro.  Discussed possible side effects.  Recheck here in 1 month.

## 2017-10-11 ENCOUNTER — Other Ambulatory Visit: Payer: Self-pay

## 2017-10-11 DIAGNOSIS — J3089 Other allergic rhinitis: Secondary | ICD-10-CM

## 2017-10-11 MED ORDER — FLUTICASONE PROPIONATE 50 MCG/ACT NA SUSP: NASAL | 3 refills | Status: DC

## 2017-10-18 ENCOUNTER — Encounter: Payer: Self-pay | Admitting: Physician Assistant

## 2017-10-18 ENCOUNTER — Telehealth: Payer: Self-pay

## 2017-10-18 NOTE — Telephone Encounter (Signed)
Called pt to see which pharmacy she wanted her Fluticasone sent to. No answer and could not leave a voice mail will try home . [place]

## 2017-10-20 ENCOUNTER — Other Ambulatory Visit: Payer: Self-pay | Admitting: Family Medicine

## 2017-11-14 ENCOUNTER — Ambulatory Visit: Payer: 59 | Admitting: Family Medicine

## 2017-11-14 DIAGNOSIS — I10 Essential (primary) hypertension: Secondary | ICD-10-CM | POA: Diagnosis not present

## 2017-11-14 MED ORDER — IRBESARTAN 75 MG PO TABS: 75.0000 mg | ORAL_TABLET | Freq: Every day | ORAL | 3 refills | Status: DC

## 2017-11-14 NOTE — Progress Notes (Signed)
   Subjective:    Patient ID: Julia Romero, female    DOB: [birth year], 57 y.o.   MRN: [id number]  HPI She is here for a recheck.  She is taking her medication and having no difficulty with it.  She does get involved in regular exercise.   Review of Systems     Objective:   Physical Exam Alert and in no distress.  Blood pressure is recorded.      Assessment & Plan:  Essential hypertension - Plan: irbesartan (AVAPRO) 75 MG tablet Blood pressure is now adequately controlled.  Encouraged her to continue with physical activity and take medication as directed.

## 2017-11-15 DIAGNOSIS — I422 Other hypertrophic cardiomyopathy: Secondary | ICD-10-CM | POA: Diagnosis not present

## 2017-11-17 ENCOUNTER — Other Ambulatory Visit: Payer: Self-pay | Admitting: Family Medicine

## 2017-12-06 ENCOUNTER — Ambulatory Visit: Payer: 59 | Admitting: Family Medicine

## 2017-12-06 ENCOUNTER — Encounter: Payer: Self-pay | Admitting: Family Medicine

## 2017-12-06 VITALS — BP 124/70 | HR 68 | Temp 97.6°F | Resp 16 | Ht 66.5 in | Wt 189.6 lb

## 2017-12-06 DIAGNOSIS — H6593 Unspecified nonsuppurative otitis media, bilateral: Secondary | ICD-10-CM | POA: Diagnosis not present

## 2017-12-06 DIAGNOSIS — H6993 Unspecified Eustachian tube disorder, bilateral: Secondary | ICD-10-CM

## 2017-12-06 DIAGNOSIS — H6983 Other specified disorders of Eustachian tube, bilateral: Secondary | ICD-10-CM | POA: Diagnosis not present

## 2017-12-06 DIAGNOSIS — J3089 Other allergic rhinitis: Secondary | ICD-10-CM | POA: Diagnosis not present

## 2017-12-06 NOTE — Progress Notes (Signed)
Subjective:  Julia Romero is a 57 y.o. female who presents for 4 day history of right ear discomfort. Denies discharge or pain. Denies any other URI symptoms.   Reports history of recurrent ear infections over the past couple of years and as a child. Has not seen ENT but this was offered by Dr. Redmond School in the past.  Does not smoke.   Denies fever, chills, headache, chest pain, cough, abdominal pain, N/V/D.   Treatment to date: sudafed, flonase, zyrtec, Astelin.   Denies sick contacts.  No other aggravating or relieving factors.  No other c/o.  ROS as in subjective.   Objective: Vitals:   12/06/17 1047  BP: 124/70  Pulse: 68  Resp: 16  Temp: 97.6 F (36.4 C)  SpO2: 98%    General appearance: Alert, WD/WN, no distress, well appearing                             Skin: warm, no rash                           Head: no sinus tenderness                            Eyes: conjunctiva normal, corneas clear, PERRLA                            Ears: pearly TMs, no erythema, clear fluid bilaterally worse on right, external ear canals normal, hearing grossly intact bilaterally                           Nose: septum midline, turbinates swollen, without erythema or discharge             Mouth/throat: MMM, tongue normal, mild pharyngeal erythema                           Neck: supple, no adenopathy, no thyromegaly, nontender                          Heart: RRR, normal S1, S2, no murmurs                         Lungs: CTA bilaterally, no wheezes, rales, or rhonchi      Assessment: OME (otitis media with effusion), bilateral  Non-seasonal allergic rhinitis, unspecified trigger  Eustachian tube dysfunction, bilateral    Plan: Discussed diagnosis and treatment of underlying allergies and OME without signs of infection.   Suggested symptomatic OTC remedies. Continue on nasal steroids, Sudafed and antihistamine. This may take a little longer since she has only been on the medications  altogether for 3 days. Discussed if she has any worsening symptoms she should be seen again. Consider ENT referral as mentioned by Dr. Redmond School previously if she develops infection or if symptoms are not clearing.

## 2017-12-08 DIAGNOSIS — L821 Other seborrheic keratosis: Secondary | ICD-10-CM | POA: Diagnosis not present

## 2017-12-08 DIAGNOSIS — D229 Melanocytic nevi, unspecified: Secondary | ICD-10-CM | POA: Diagnosis not present

## 2017-12-20 ENCOUNTER — Other Ambulatory Visit: Payer: Self-pay | Admitting: Family Medicine

## 2018-01-23 ENCOUNTER — Other Ambulatory Visit: Payer: Self-pay | Admitting: Family Medicine

## 2018-02-14 DIAGNOSIS — I422 Other hypertrophic cardiomyopathy: Secondary | ICD-10-CM | POA: Diagnosis not present

## 2018-03-28 ENCOUNTER — Telehealth: Payer: Self-pay | Admitting: Family Medicine

## 2018-03-28 DIAGNOSIS — J3089 Other allergic rhinitis: Secondary | ICD-10-CM

## 2018-03-28 MED ORDER — AZELASTINE HCL 0.1 % NA SOLN: 1.0000 | Freq: Two times a day (BID) | NASAL | 3 refills | Status: DC

## 2018-03-28 NOTE — Telephone Encounter (Signed)
Pt requesting that Azelastine nasal spray refill be sent to [place] at [facility] at [facility]

## 2018-04-19 ENCOUNTER — Other Ambulatory Visit: Payer: Self-pay | Admitting: Family Medicine

## 2018-05-16 DIAGNOSIS — I472 Ventricular tachycardia: Secondary | ICD-10-CM | POA: Diagnosis not present

## 2018-05-22 DIAGNOSIS — J019 Acute sinusitis, unspecified: Secondary | ICD-10-CM | POA: Diagnosis not present

## 2018-05-29 DIAGNOSIS — Z95810 Presence of automatic (implantable) cardiac defibrillator: Secondary | ICD-10-CM | POA: Diagnosis not present

## 2018-05-29 DIAGNOSIS — I422 Other hypertrophic cardiomyopathy: Secondary | ICD-10-CM | POA: Diagnosis not present

## 2018-05-29 DIAGNOSIS — I472 Ventricular tachycardia: Secondary | ICD-10-CM | POA: Diagnosis not present

## 2018-07-20 ENCOUNTER — Other Ambulatory Visit: Payer: Self-pay | Admitting: Family Medicine

## 2018-09-06 ENCOUNTER — Encounter: Payer: Self-pay | Admitting: Internal Medicine

## 2018-10-04 DIAGNOSIS — Z1389 Encounter for screening for other disorder: Secondary | ICD-10-CM | POA: Diagnosis not present

## 2018-10-04 DIAGNOSIS — Z1231 Encounter for screening mammogram for malignant neoplasm of breast: Secondary | ICD-10-CM | POA: Diagnosis not present

## 2018-10-04 DIAGNOSIS — Z01419 Encounter for gynecological examination (general) (routine) without abnormal findings: Secondary | ICD-10-CM | POA: Diagnosis not present

## 2018-10-04 DIAGNOSIS — Z130 Encounter for screening for diseases of the blood and blood-forming organs and certain disorders involving the immune mechanism: Secondary | ICD-10-CM | POA: Diagnosis not present

## 2018-10-04 LAB — HM MAMMOGRAPHY

## 2018-10-23 ENCOUNTER — Other Ambulatory Visit: Payer: Self-pay | Admitting: Family Medicine

## 2018-11-08 ENCOUNTER — Other Ambulatory Visit: Payer: Self-pay | Admitting: Family Medicine

## 2018-11-08 DIAGNOSIS — I10 Essential (primary) hypertension: Secondary | ICD-10-CM

## 2018-11-14 ENCOUNTER — Ambulatory Visit: Payer: 59 | Admitting: Family Medicine

## 2018-11-14 ENCOUNTER — Other Ambulatory Visit: Payer: Self-pay

## 2018-11-14 ENCOUNTER — Encounter: Payer: Self-pay | Admitting: Family Medicine

## 2018-11-14 VITALS — Wt 200.0 lb

## 2018-11-14 DIAGNOSIS — Z86010 Personal history of colonic polyps: Secondary | ICD-10-CM

## 2018-11-14 DIAGNOSIS — J3089 Other allergic rhinitis: Secondary | ICD-10-CM

## 2018-11-14 DIAGNOSIS — Z13220 Encounter for screening for lipoid disorders: Secondary | ICD-10-CM

## 2018-11-14 DIAGNOSIS — B001 Herpesviral vesicular dermatitis: Secondary | ICD-10-CM | POA: Diagnosis not present

## 2018-11-14 DIAGNOSIS — J301 Allergic rhinitis due to pollen: Secondary | ICD-10-CM | POA: Diagnosis not present

## 2018-11-14 DIAGNOSIS — I10 Essential (primary) hypertension: Secondary | ICD-10-CM

## 2018-11-14 DIAGNOSIS — Z1159 Encounter for screening for other viral diseases: Secondary | ICD-10-CM

## 2018-11-14 MED ORDER — AZELASTINE HCL 0.1 % NA SOLN: 1.0000 | Freq: Two times a day (BID) | NASAL | 3 refills | Status: DC

## 2018-11-14 MED ORDER — IRBESARTAN 75 MG PO TABS: 75.0000 mg | ORAL_TABLET | Freq: Every day | ORAL | 3 refills | Status: DC

## 2018-11-14 MED ORDER — VALACYCLOVIR HCL 1 G PO TABS: 1000.0000 mg | ORAL_TABLET | Freq: Two times a day (BID) | ORAL | 1 refills | Status: DC

## 2018-11-14 MED ORDER — CETIRIZINE HCL 10 MG PO TABS: 10.0000 mg | ORAL_TABLET | Freq: Every day | ORAL | 3 refills | Status: DC

## 2018-11-14 MED ORDER — FLUTICASONE PROPIONATE 50 MCG/ACT NA SUSP: 1.0000 | Freq: Every day | NASAL | 3 refills | Status: DC

## 2018-11-14 NOTE — Addendum Note (Signed)
Addended by: Denita Lung on: 11/14/2018 10:12 AM   Modules accepted: Orders

## 2018-11-14 NOTE — Progress Notes (Signed)
   Subjective:    Patient ID: Julia Romero, female    DOB: [birth year], 58 y.o.   MRN: [id number]  HPI Documentation for virtual telephone encounter.  Documentation for virtual audio and video telecommunications through [facility] encounter: The patient was located at home. The provider was located in the office. The patient did consent to this visit and is aware of possible charges through their insurance for this visit.  The other persons participating in this telemedicine service were none. Time spent on call was 5 minutes and in review of previous records >25 minutes total.  This virtual service is not related to other E/M service within previous 7 days. She is here for an interval evaluation.  She has had recent gynecologic evaluation and to get mammogram Pap and pelvic.  She does have underlying allergies and there well-controlled on Flonase, Astelin, Zyrtec.  She will need these renewed.  She continues on Avapro.  She does state that her blood pressure in the GYN office was in the normal range.  She does have herpes labialis and would like a refill on that.  She also has a history of colonic polyps and is slightly overdue for repeat colonoscopy.  Otherwise she seems to be doing quite well and has no other concerns or complaints.     Review of Systems     Objective:   Physical Exam Alert and in no distress  Assessment & Plan:  Non-seasonal allergic rhinitis due to pollen  Non-seasonal allergic rhinitis, unspecified trigger - Plan: fluticasone (FLONASE) 50 MCG/ACT nasal spray, cetirizine (ZYRTEC) 10 MG tablet, azelastine (ASTELIN) 0.1 % nasal spray  Herpes labialis - Plan: valACYclovir (VALTREX) 1000 MG tablet  History of colonic polyps - Plan: Ambulatory referral to Gastroenterology  Essential hypertension - Plan: CBC with Differential/Platelet, Comprehensive metabolic panel, irbesartan (AVAPRO) 75 MG tablet  Screening for lipid disorders - Plan: Lipid panel Encouraged her  to continue to take good care of herself.  She will be set up to get another colonoscopy.  She is to return here in about 1 month for blood work and blood pressure check.  She is comfortable with this.  We will also get the report on her mammogram.

## 2018-12-14 ENCOUNTER — Other Ambulatory Visit: Payer: Self-pay

## 2018-12-14 ENCOUNTER — Other Ambulatory Visit: Payer: 59

## 2018-12-14 ENCOUNTER — Encounter: Payer: Self-pay | Admitting: Internal Medicine

## 2018-12-14 DIAGNOSIS — Z1159 Encounter for screening for other viral diseases: Secondary | ICD-10-CM

## 2018-12-14 DIAGNOSIS — Z13220 Encounter for screening for lipoid disorders: Secondary | ICD-10-CM

## 2018-12-14 DIAGNOSIS — I10 Essential (primary) hypertension: Secondary | ICD-10-CM

## 2018-12-14 LAB — LIPID PANEL

## 2018-12-15 LAB — LIPID PANEL
Chol/HDL Ratio: 4.3 ratio (ref 0.0–4.4)
Cholesterol, Total: 249 mg/dL — ABNORMAL HIGH (ref 100–199)
HDL: 58 mg/dL (ref >39)
LDL Calculated: 165 mg/dL — ABNORMAL HIGH (ref 0–99)
Triglycerides: 132 mg/dL (ref 0–149)
VLDL Cholesterol Cal: 26 mg/dL (ref 5–40)

## 2018-12-15 LAB — CBC WITH DIFFERENTIAL/PLATELET
Basophils Absolute: 0 10*3/uL (ref 0.0–0.2)
Basos: 1 %
EOS (ABSOLUTE): 0.1 10*3/uL (ref 0.0–0.4)
Eos: 2 %
Hematocrit: 43.6 % (ref 34.0–46.6)
Hemoglobin: 14.5 g/dL (ref 11.1–15.9)
Immature Grans (Abs): 0 10*3/uL (ref 0.0–0.1)
Immature Granulocytes: 0 %
Lymphocytes Absolute: 1.9 10*3/uL (ref 0.7–3.1)
Lymphs: 34 %
MCH: 30 pg (ref 26.6–33.0)
MCHC: 33.3 g/dL (ref 31.5–35.7)
MCV: 90 fL (ref 79–97)
Monocytes Absolute: 0.5 10*3/uL (ref 0.1–0.9)
Monocytes: 9 %
Neutrophils Absolute: 3 10*3/uL (ref 1.4–7.0)
Neutrophils: 54 %
Platelets: 236 10*3/uL (ref 150–450)
RBC: 4.84 x10E6/uL (ref 3.77–5.28)
RDW: 13.1 % (ref 11.7–15.4)
WBC: 5.4 10*3/uL (ref 3.4–10.8)

## 2018-12-15 LAB — COMPREHENSIVE METABOLIC PANEL
ALT: 19 IU/L (ref 0–32)
AST: 28 IU/L (ref 0–40)
Albumin/Globulin Ratio: 1.9 (ref 1.2–2.2)
Albumin: 4.3 g/dL (ref 3.8–4.9)
Alkaline Phosphatase: 88 IU/L (ref 39–117)
BUN/Creatinine Ratio: 14 (ref 9–23)
BUN: 12 mg/dL (ref 6–24)
Bilirubin Total: 0.6 mg/dL (ref 0.0–1.2)
CO2: 24 mmol/L (ref 20–29)
Calcium: 9.4 mg/dL (ref 8.7–10.2)
Chloride: 100 mmol/L (ref 96–106)
Creatinine, Ser: 0.83 mg/dL (ref 0.57–1.00)
GFR calc Af Amer: 91 mL/min/{1.73_m2} (ref >59)
GFR calc non Af Amer: 79 mL/min/{1.73_m2} (ref >59)
Globulin, Total: 2.3 g/dL (ref 1.5–4.5)
Glucose: 100 mg/dL — ABNORMAL HIGH (ref 65–99)
Potassium: 4.5 mmol/L (ref 3.5–5.2)
Sodium: 138 mmol/L (ref 134–144)
Total Protein: 6.6 g/dL (ref 6.0–8.5)

## 2018-12-15 LAB — HEPATITIS C ANTIBODY: Hep C Virus Ab: 0.1 s/co ratio (ref 0.0–0.9)

## 2018-12-21 ENCOUNTER — Ambulatory Visit: Payer: 59 | Admitting: Family Medicine

## 2018-12-21 ENCOUNTER — Other Ambulatory Visit: Payer: Self-pay

## 2018-12-21 ENCOUNTER — Encounter: Payer: Self-pay | Admitting: Family Medicine

## 2018-12-21 VITALS — BP 140/88 | HR 70 | Temp 98.2°F | Wt 202.8 lb

## 2018-12-21 DIAGNOSIS — I10 Essential (primary) hypertension: Secondary | ICD-10-CM

## 2018-12-21 DIAGNOSIS — E785 Hyperlipidemia, unspecified: Secondary | ICD-10-CM | POA: Diagnosis not present

## 2018-12-21 NOTE — Progress Notes (Signed)
   Subjective:    Patient ID: Julia Romero, female    DOB: [birth year], 58 y.o.   MRN: [id number]  HPI She is here for recheck on her blood pressure.  She has no particular concerns or complaints.  Presently she is on Avapro. She had recent blood work which did show elevated lipids.  Review of Systems     Objective:   Physical Exam Alert and in no distress.  Blood pressure is recorded.       Assessment & Plan:  Essential hypertension  Hyperlipidemia, unspecified hyperlipidemia type Since she just got a 90-day supply of the Avapro, I will have her double the dosing to 150 mg and recheck here in 1 month. Also discussed hyperlipidemia with her and encouraged her to cut back on high cholesterol containing foods.  Recommend several websites for her to potentially go to.

## 2019-01-02 ENCOUNTER — Other Ambulatory Visit: Payer: Self-pay

## 2019-01-02 ENCOUNTER — Ambulatory Visit: Payer: 59

## 2019-01-02 VITALS — Ht 66.0 in | Wt 200.0 lb

## 2019-01-02 DIAGNOSIS — Z86010 Personal history of colonic polyps: Secondary | ICD-10-CM

## 2019-01-02 MED ORDER — SUPREP BOWEL PREP KIT 17.5-3.13-1.6 GM/177ML PO SOLN: 1.0000 | Freq: Once | ORAL | 0 refills | Status: AC

## 2019-01-02 NOTE — Progress Notes (Signed)
Per pt, no allergies to soy or egg products.Pt not taking any weight loss meds or using  O2 at home.  Pt denies sedation problems. Pt refused emmi video.  The PV was done over the phone due to COVID-19. I verified the pt's address and insurance. Prep instructions and medical hx was reviewed with the pt and will mail prep instructions to the pt today. Informed the pt to call with any questions or changes prior to her procedure. She understood.

## 2019-01-04 ENCOUNTER — Encounter: Payer: Self-pay | Admitting: Internal Medicine

## 2019-01-15 ENCOUNTER — Telehealth: Payer: Self-pay | Admitting: Internal Medicine

## 2019-01-15 NOTE — Telephone Encounter (Signed)
LM on Vmail to call back regarding Covid-19 screening questions Covid-19 Screening Questions  Do you now or have you had a fever in the last 14 days?    Do you have any respiratory symptoms of shortness of breath or cough now or in the last 14 days?     Do you have any family members or close contacts with diagnosed or suspected Covid-19 in the past 14 days?  Have you been tested for Covid-19 and found to be positive?

## 2019-01-16 ENCOUNTER — Ambulatory Visit (AMBULATORY_SURGERY_CENTER): Payer: 59 | Admitting: Internal Medicine

## 2019-01-16 ENCOUNTER — Encounter: Payer: Self-pay | Admitting: Internal Medicine

## 2019-01-16 ENCOUNTER — Other Ambulatory Visit: Payer: Self-pay

## 2019-01-16 ENCOUNTER — Telehealth: Payer: Self-pay

## 2019-01-16 VITALS — BP 127/75 | HR 57 | Temp 97.7°F | Resp 22 | Ht 66.0 in | Wt 200.0 lb

## 2019-01-16 DIAGNOSIS — Z86010 Personal history of colonic polyps: Secondary | ICD-10-CM | POA: Diagnosis not present

## 2019-01-16 MED ORDER — SODIUM CHLORIDE 0.9 % IV SOLN: 500.0000 mL | Freq: Once | INTRAVENOUS | Status: DC

## 2019-01-16 NOTE — Op Note (Signed)
[facility] Patient Name: Julia Romero Procedure Date: 01/16/2019 9:54 AM MRN: [id number] Endoscopist: Docia Chuck. Henrene Pastor , MD Age: 58 Referring MD:  Date of Birth: 07/11/1961 Gender: Female Account #: [id number] Procedure:                Colonoscopy Indications:              High risk colon cancer surveillance: Personal                            history of non-advanced adenomas. December 2014 Medicines:                Monitored Anesthesia Care Procedure:                Pre-Anesthesia Assessment:                           - Prior to the procedure, a History and Physical                            was performed, and patient medications and                            allergies were reviewed. The patient's tolerance of                            previous anesthesia was also reviewed. The risks                            and benefits of the procedure and the sedation                            options and risks were discussed with the patient.                            All questions were answered, and informed consent                            was obtained. Prior Anticoagulants: The patient has                            taken no previous anticoagulant or antiplatelet                            agents. ASA Grade Assessment: II - A patient with                            mild systemic disease. After reviewing the risks                            and benefits, the patient was deemed in                            satisfactory condition to undergo the procedure.  After obtaining informed consent, the colonoscope                            was passed under direct vision. Throughout the                            procedure, the patient's blood pressure, pulse, and                            oxygen saturations were monitored continuously. The                            Colonoscope was introduced through the anus and                            advanced to the the  cecum, identified by                            appendiceal orifice and ileocecal valve. The                            ileocecal valve, appendiceal orifice, and rectum                            were photographed. The quality of the bowel                            preparation was excellent. The colonoscopy was                            performed without difficulty. The patient tolerated                            the procedure well. The bowel preparation used was                            SUPREP via split dose instruction. Scope In: 10:01:54 AM Scope Out: 10:15:51 AM Scope Withdrawal Time: 0 hours 10 minutes 53 seconds  Total Procedure Duration: 0 hours 13 minutes 57 seconds  Findings:                 Multiple diverticula were found in the sigmoid                            colon and ascending colon.                           The exam was otherwise without abnormality on                            direct and retroflexion views. Complications:            No immediate complications. Estimated blood loss:  None. Estimated Blood Loss:     Estimated blood loss: none. Impression:               - Diverticulosis in the sigmoid colon and in the                            ascending colon.                           - The examination was otherwise normal on direct                            and retroflexion views.                           - No specimens collected. Recommendation:           - Repeat colonoscopy in 10 years for surveillance.                           - Patient has a contact number available for                            emergencies. The signs and symptoms of potential                            delayed complications were discussed with the                            patient. Return to normal activities tomorrow.                            Written discharge instructions were provided to the                            patient.                            - Resume previous diet.                           - Continue present medications. Docia Chuck. Henrene Pastor, MD 01/16/2019 10:23:57 AM This report has been signed electronically.

## 2019-01-16 NOTE — Progress Notes (Signed)
Pt's states no medical or surgical changes since previsit or office visit. 

## 2019-01-16 NOTE — Telephone Encounter (Signed)
Called pt to advise of appt 01-22-19 and to complete screening [facility]

## 2019-01-16 NOTE — Progress Notes (Signed)
Julia Romero took temp and [name] tok vitals.

## 2019-01-16 NOTE — Patient Instructions (Signed)
YOU HAD AN ENDOSCOPIC PROCEDURE TODAY AT [facility]:   Refer to the procedure report that was given to you for any specific questions about what was found during the examination.  If the procedure report does not answer your questions, please call your gastroenterologist to clarify.  If you requested that your care partner not be given the details of your procedure findings, then the procedure report has been included in a sealed envelope for you to review at your convenience later.  YOU SHOULD EXPECT: Some feelings of bloating in the abdomen. Passage of more gas than usual.  Walking can help get rid of the air that was put into your GI tract during the procedure and reduce the bloating. If you had a lower endoscopy (such as a colonoscopy or flexible sigmoidoscopy) you may notice spotting of blood in your stool or on the toilet paper. If you underwent a bowel prep for your procedure, you may not have a normal bowel movement for a few days.  Please Note:  You might notice some irritation and congestion in your nose or some drainage.  This is from the oxygen used during your procedure.  There is no need for concern and it should clear up in a day or so.  SYMPTOMS TO REPORT IMMEDIATELY:   Following lower endoscopy (colonoscopy or flexible sigmoidoscopy):  Excessive amounts of blood in the stool  Significant tenderness or worsening of abdominal pains  Swelling of the abdomen that is new, acute  Fever of 100F or higher    For urgent or emergent issues, a gastroenterologist can be reached at any hour by calling [phone].   DIET:  We do recommend a small meal at first, but then you may proceed to your regular diet.  Drink plenty of fluids but you should avoid alcoholic beverages for 24 hours.  ACTIVITY:  You should plan to take it easy for the rest of today and you should NOT DRIVE or use heavy machinery until tomorrow (because of the sedation medicines used during the test).     FOLLOW UP: Our staff will call the number listed on your records 48-72 hours following your procedure to check on you and address any questions or concerns that you may have regarding the information given to you following your procedure. If we do not reach you, we will leave a message.  We will attempt to reach you two times.  During this call, we will ask if you have developed any symptoms of COVID 19. If you develop any symptoms (ie: fever, flu-like symptoms, shortness of breath, cough etc.) before then, please call [phone].  If you test positive for Covid 19 in the 2 weeks post procedure, please call and report this information to [place].    If any biopsies were taken you will be contacted by phone or by letter within the next 1-3 weeks.  Please call us at [phone] if you have not heard about the biopsies in 3 weeks.    SIGNATURES/CONFIDENTIALITY: You and/or your care partner have signed paperwork which will be entered into your electronic medical record.  These signatures attest to the fact that that the information above on your After Visit Summary has been reviewed and is understood.  Full responsibility of the confidentiality of this discharge information lies with you and/or your care-partner.    Handout was given to you on diverticulosis. You may resume your current medications today. Repeat colonoscopy in 10 years for surveillance. Please call  if any questions or concerns.

## 2019-01-16 NOTE — Progress Notes (Signed)
PT taken to PACU. Monitors in place. VSS. Report given to RN. 

## 2019-01-16 NOTE — Progress Notes (Signed)
No problems noted in the recovery room. 

## 2019-01-18 ENCOUNTER — Telehealth: Payer: Self-pay | Admitting: *Deleted

## 2019-01-18 NOTE — Telephone Encounter (Signed)
  Follow up Call-  Call back number 01/16/2019  Post procedure Call Back phone  # [phone]  Permission to leave phone message Yes  Some recent data might be hidden     Patient questions:  Do you have a fever, pain , or abdominal swelling? No. Pain Score  0 *  Have you tolerated food without any problems? Yes.    Have you been able to return to your normal activities? Yes.    Do you have any questions about your discharge instructions: Diet   No. Medications  No. Follow up visit  No.  Do you have questions or concerns about your Care? No.  Actions: * If pain score is 4 or above: No action needed, pain <4.  1. Have you developed a fever since your procedure? NO  2.   Have you had an respiratory symptoms (SOB or cough) since your procedure? NO  3.   Have you tested positive for COVID 19 since your procedure NO  4.   Have you had any family members/close contacts diagnosed with the COVID 19 since your procedure?  NO  If yes to any of these questions please route to Joylene John, RN and Alphonsa Gin, RN.

## 2019-01-22 ENCOUNTER — Other Ambulatory Visit: Payer: Self-pay

## 2019-01-22 ENCOUNTER — Encounter: Payer: Self-pay | Admitting: Family Medicine

## 2019-01-22 ENCOUNTER — Ambulatory Visit: Payer: 59 | Admitting: Family Medicine

## 2019-01-22 VITALS — BP 130/82 | HR 81 | Temp 98.3°F | Wt 200.6 lb

## 2019-01-22 DIAGNOSIS — I10 Essential (primary) hypertension: Secondary | ICD-10-CM | POA: Diagnosis not present

## 2019-01-22 MED ORDER — IRBESARTAN 150 MG PO TABS: 150.0000 mg | ORAL_TABLET | Freq: Every day | ORAL | 3 refills | Status: DC

## 2019-01-22 NOTE — Progress Notes (Signed)
   Subjective:    Patient ID: Julia Romero, female    DOB: 07/05/1961, 58 y.o.   MRN: [id number]  HPI She is here for recheck on her blood pressure.  She is now taking 150 mg of Avapro daily.  Having no difficulty with that.   Review of Systems     Objective:   Physical Exam Alert and in no distress.  Blood pressure is recorded       Assessment & Plan:  Essential hypertension - Plan: irbesartan (AVAPRO) 150 MG tablet, return here in 1 year.  Also recommend getting flu shot this fall.

## 2019-01-24 ENCOUNTER — Telehealth: Payer: Self-pay | Admitting: Family Medicine

## 2019-01-24 NOTE — Telephone Encounter (Signed)
Requested records received from [facility]

## 2019-02-06 ENCOUNTER — Other Ambulatory Visit: Payer: Self-pay | Admitting: Family Medicine

## 2019-02-06 DIAGNOSIS — J3089 Other allergic rhinitis: Secondary | ICD-10-CM

## 2019-03-22 ENCOUNTER — Other Ambulatory Visit: Payer: Self-pay | Admitting: Family Medicine

## 2019-03-22 DIAGNOSIS — J3089 Other allergic rhinitis: Secondary | ICD-10-CM

## 2019-04-19 ENCOUNTER — Other Ambulatory Visit: Payer: Self-pay | Admitting: Family Medicine

## 2019-04-19 DIAGNOSIS — J3089 Other allergic rhinitis: Secondary | ICD-10-CM

## 2019-05-16 ENCOUNTER — Other Ambulatory Visit: Payer: Self-pay | Admitting: Family Medicine

## 2019-05-16 DIAGNOSIS — J3089 Other allergic rhinitis: Secondary | ICD-10-CM

## 2019-07-07 ENCOUNTER — Other Ambulatory Visit: Payer: Self-pay | Admitting: Family Medicine

## 2019-07-07 DIAGNOSIS — J3089 Other allergic rhinitis: Secondary | ICD-10-CM

## 2019-07-20 LAB — HM COLONOSCOPY

## 2019-08-03 ENCOUNTER — Other Ambulatory Visit: Payer: Self-pay | Admitting: Family Medicine

## 2019-08-03 DIAGNOSIS — J3089 Other allergic rhinitis: Secondary | ICD-10-CM

## 2019-08-10 ENCOUNTER — Telehealth: Payer: Self-pay | Admitting: Family Medicine

## 2019-08-10 ENCOUNTER — Telehealth: Payer: Self-pay

## 2019-08-10 NOTE — Telephone Encounter (Signed)
Sharnay states she spoke to you yesterday and would like to talk to you again. She got email back about the rx's she spoke to you about and they do not need PA, looks like she is going to need to have those medications changed and would like to talk to you

## 2019-08-10 NOTE — Telephone Encounter (Signed)
Pt. Called stating that she needed a PA done for her azelastine spay 1% per [facility] pt. Does not require a PA for this medication at this time.

## 2019-08-13 NOTE — Telephone Encounter (Signed)
Called pt. LM to see about her medications.

## 2019-08-14 ENCOUNTER — Telehealth: Payer: Self-pay

## 2019-08-14 NOTE — Telephone Encounter (Signed)
There is not a generic for azelastine and have her switch to Rhinocort.  Let her know that the Rhinocort is over-the-counter.

## 2019-08-14 NOTE — Telephone Encounter (Signed)
Pt. Called stating that her Azelastine 0.1% nasal spray and her Fluticasone nasal spray had been moved to a third tier on her insurance plan and is now going to cost her 60 bucks a months for each one. I submitted a PA for both meds and both said that it was not required for these medications, the pt. Wants to know if you could switch both of these meds to something similar that may be cheaper, she already checked [facility] and they told her to call her doctor's office they did not have a list of preferred meds.

## 2019-08-16 NOTE — Telephone Encounter (Signed)
Rhinocort will replace fluticasone.

## 2019-08-16 NOTE — Telephone Encounter (Signed)
Pt was advised and wanted to know what should she take in the place of the fluticasone as well. Please advise [facility]

## 2019-08-20 NOTE — Telephone Encounter (Signed)
LVM advising pt . K[facility] °

## 2019-10-18 NOTE — Progress Notes (Signed)
Error

## 2019-11-16 ENCOUNTER — Other Ambulatory Visit: Payer: Self-pay | Admitting: Family Medicine

## 2019-11-16 DIAGNOSIS — I10 Essential (primary) hypertension: Secondary | ICD-10-CM

## 2019-12-19 ENCOUNTER — Ambulatory Visit: Payer: 59 | Admitting: Family Medicine

## 2019-12-19 ENCOUNTER — Encounter: Payer: Self-pay | Admitting: Family Medicine

## 2019-12-19 ENCOUNTER — Other Ambulatory Visit: Payer: Self-pay

## 2019-12-19 VITALS — BP 130/84 | HR 70 | Temp 98.2°F | Ht 66.5 in | Wt 199.8 lb

## 2019-12-19 DIAGNOSIS — I10 Essential (primary) hypertension: Secondary | ICD-10-CM | POA: Diagnosis not present

## 2019-12-19 DIAGNOSIS — J3089 Other allergic rhinitis: Secondary | ICD-10-CM | POA: Diagnosis not present

## 2019-12-19 DIAGNOSIS — E785 Hyperlipidemia, unspecified: Secondary | ICD-10-CM | POA: Diagnosis not present

## 2019-12-19 DIAGNOSIS — Z86010 Personal history of colonic polyps: Secondary | ICD-10-CM

## 2019-12-19 DIAGNOSIS — J301 Allergic rhinitis due to pollen: Secondary | ICD-10-CM

## 2019-12-19 DIAGNOSIS — E669 Obesity, unspecified: Secondary | ICD-10-CM

## 2019-12-19 DIAGNOSIS — B001 Herpesviral vesicular dermatitis: Secondary | ICD-10-CM

## 2019-12-19 DIAGNOSIS — Z0000 Encounter for general adult medical examination without abnormal findings: Secondary | ICD-10-CM | POA: Diagnosis not present

## 2019-12-19 MED ORDER — IRBESARTAN 150 MG PO TABS: 150.0000 mg | ORAL_TABLET | Freq: Every day | ORAL | 3 refills | Status: DC

## 2019-12-19 MED ORDER — VALACYCLOVIR HCL 1 G PO TABS: 1000.0000 mg | ORAL_TABLET | Freq: Two times a day (BID) | ORAL | 1 refills | Status: DC

## 2019-12-19 MED ORDER — CETIRIZINE HCL 10 MG PO TABS: 10.0000 mg | ORAL_TABLET | Freq: Every day | ORAL | 3 refills | Status: DC

## 2019-12-19 NOTE — Progress Notes (Signed)
   Subjective:    Patient ID: Julia Romero, female    DOB: [birth year], 59 y.o.   MRN: [id number]  HPI She is here for complete examination.  She does have underlying allergies and is presently taking Astelin Flonase and Zyrtec.  Plans to change this and stop the Astelin due to cost.  She does have occasional difficulty with herpes labialis and gets good results with Valtrex.  She continues on her Avapro for blood pressure and having no difficulty with that.  She has had Pap and mammogram done.  She does have a history of colonic polyps.  Family and social history as well as health maintenance and immunizations was reviewed   Review of Systems  All other systems reviewed and are negative.      Objective:   Physical Exam Alert and in no distress. Tympanic membranes and canals are normal. Pharyngeal area is normal. Neck is supple without adenopathy or thyromegaly. Cardiac exam shows a regular sinus rhythm without murmurs or gallops. Lungs are clear to auscultation.        Assessment & Plan:  Routine general medical examination at a health care facility - Plan: Comprehensive metabolic panel, CBC with Differential/Platelet, Lipid panel  Herpes labialis - Plan: valACYclovir (VALTREX) 1000 MG tablet  Non-seasonal allergic rhinitis, unspecified trigger - Plan: cetirizine (ZYRTEC) 10 MG tablet  Essential hypertension - Plan: Comprehensive metabolic panel, CBC with Differential/Platelet, irbesartan (AVAPRO) 150 MG tablet  Hyperlipidemia, unspecified hyperlipidemia type - Plan: Lipid panel  Obesity (BMI 30.0-34.9)  Non-seasonal allergic rhinitis due to pollen  History of colonic polyps - Plan: Ambulatory referral to Gastroenterology Recommend she add Rhinocort to her allergy medicine. Discussed weight loss with her in terms of diet and exercise suggesting 20 minutes of something physical as well as cutting back on carbohydrates. Continue on Valtrex on an as-needed basis.

## 2019-12-20 ENCOUNTER — Other Ambulatory Visit: Payer: Self-pay

## 2019-12-20 DIAGNOSIS — E785 Hyperlipidemia, unspecified: Secondary | ICD-10-CM

## 2019-12-20 LAB — COMPREHENSIVE METABOLIC PANEL
ALT: 29 IU/L (ref 0–32)
AST: 35 IU/L (ref 0–40)
Albumin/Globulin Ratio: 1.8 (ref 1.2–2.2)
Albumin: 4.6 g/dL (ref 3.8–4.9)
Alkaline Phosphatase: 105 IU/L (ref 48–121)
BUN/Creatinine Ratio: 14 (ref 9–23)
BUN: 10 mg/dL (ref 6–24)
Bilirubin Total: 0.6 mg/dL (ref 0.0–1.2)
CO2: 25 mmol/L (ref 20–29)
Calcium: 9.7 mg/dL (ref 8.7–10.2)
Chloride: 104 mmol/L (ref 96–106)
Creatinine, Ser: 0.71 mg/dL (ref 0.57–1.00)
GFR calc Af Amer: 109 mL/min/{1.73_m2} (ref >59)
GFR calc non Af Amer: 95 mL/min/{1.73_m2} (ref >59)
Globulin, Total: 2.5 g/dL (ref 1.5–4.5)
Glucose: 106 mg/dL — ABNORMAL HIGH (ref 65–99)
Potassium: 4.1 mmol/L (ref 3.5–5.2)
Sodium: 141 mmol/L (ref 134–144)
Total Protein: 7.1 g/dL (ref 6.0–8.5)

## 2019-12-20 LAB — CBC WITH DIFFERENTIAL/PLATELET
Basophils Absolute: 0 10*3/uL (ref 0.0–0.2)
Basos: 1 %
EOS (ABSOLUTE): 0.1 10*3/uL (ref 0.0–0.4)
Eos: 2 %
Hematocrit: 43.1 % (ref 34.0–46.6)
Hemoglobin: 14.6 g/dL (ref 11.1–15.9)
Immature Grans (Abs): 0 10*3/uL (ref 0.0–0.1)
Immature Granulocytes: 0 %
Lymphocytes Absolute: 1.8 10*3/uL (ref 0.7–3.1)
Lymphs: 33 %
MCH: 30.9 pg (ref 26.6–33.0)
MCHC: 33.9 g/dL (ref 31.5–35.7)
MCV: 91 fL (ref 79–97)
Monocytes Absolute: 0.5 10*3/uL (ref 0.1–0.9)
Monocytes: 9 %
Neutrophils Absolute: 3 10*3/uL (ref 1.4–7.0)
Neutrophils: 55 %
Platelets: 239 10*3/uL (ref 150–450)
RBC: 4.72 x10E6/uL (ref 3.77–5.28)
RDW: 13.1 % (ref 11.7–15.4)
WBC: 5.5 10*3/uL (ref 3.4–10.8)

## 2019-12-20 LAB — LIPID PANEL
Chol/HDL Ratio: 4.6 ratio — ABNORMAL HIGH (ref 0.0–4.4)
Cholesterol, Total: 283 mg/dL — ABNORMAL HIGH (ref 100–199)
HDL: 62 mg/dL (ref >39)
LDL Chol Calc (NIH): 201 mg/dL — ABNORMAL HIGH (ref 0–99)
Triglycerides: 115 mg/dL (ref 0–149)
VLDL Cholesterol Cal: 20 mg/dL (ref 5–40)

## 2020-02-19 ENCOUNTER — Other Ambulatory Visit: Payer: 59

## 2020-02-19 ENCOUNTER — Other Ambulatory Visit: Payer: Self-pay

## 2020-02-19 DIAGNOSIS — E785 Hyperlipidemia, unspecified: Secondary | ICD-10-CM

## 2020-02-20 LAB — LIPID PANEL
Chol/HDL Ratio: 4.7 ratio — ABNORMAL HIGH (ref 0.0–4.4)
Cholesterol, Total: 295 mg/dL — ABNORMAL HIGH (ref 100–199)
HDL: 63 mg/dL (ref >39)
LDL Chol Calc (NIH): 209 mg/dL — ABNORMAL HIGH (ref 0–99)
Triglycerides: 129 mg/dL (ref 0–149)
VLDL Cholesterol Cal: 23 mg/dL (ref 5–40)

## 2020-07-21 ENCOUNTER — Telehealth: Payer: 59 | Admitting: Medical

## 2020-07-21 ENCOUNTER — Other Ambulatory Visit (INDEPENDENT_AMBULATORY_CARE_PROVIDER_SITE_OTHER): Payer: 59

## 2020-07-21 ENCOUNTER — Encounter: Payer: Self-pay | Admitting: Medical

## 2020-07-21 ENCOUNTER — Other Ambulatory Visit: Payer: Self-pay

## 2020-07-21 VITALS — Ht 66.0 in | Wt 200.0 lb

## 2020-07-21 DIAGNOSIS — R059 Cough, unspecified: Secondary | ICD-10-CM

## 2020-07-21 DIAGNOSIS — J3489 Other specified disorders of nose and nasal sinuses: Secondary | ICD-10-CM | POA: Diagnosis not present

## 2020-07-21 DIAGNOSIS — Z20822 Contact with and (suspected) exposure to covid-19: Secondary | ICD-10-CM | POA: Diagnosis not present

## 2020-07-21 LAB — POC COVID19 BINAXNOW: SARS Coronavirus 2 Ag: NEGATIVE

## 2020-07-21 MED ORDER — AMOXICILLIN-POT CLAVULANATE 875-125 MG PO TABS
1.0000 | ORAL_TABLET | Freq: Two times a day (BID) | ORAL | 0 refills | Status: DC
Start: 2020-07-21 — End: 2023-01-05

## 2020-07-21 NOTE — Addendum Note (Signed)
Addended by: Jac Canavan on: 07/21/2020 11:40 AM   Modules accepted: Orders

## 2020-07-21 NOTE — Progress Notes (Addendum)
Subjective:     Patient ID: Julia Romero, female   DOB: 1960/09/27, 60 y.o.   MRN: [id number]  This visit type was conducted due to national recommendations for restrictions regarding the COVID-19 Pandemic (e.g. social distancing) in an effort to limit this patient's exposure and mitigate transmission in our community.  Due to their co-morbid illnesses, this patient is at least at moderate risk for complications without adequate follow up.  This format is felt to be most appropriate for this patient at this time.    Documentation for virtual audio and video telecommunications through [facility] encounter:  The patient was located at home. The provider was located in the office. The patient did consent to this visit and is aware of possible charges through their insurance for this visit.  The other persons participating in this telemedicine service were none. Time spent on call was 20 minutes and in review of previous records 20 minutes total.  This virtual service is not related to other E/M service within previous 7 days.   HPI Chief Complaint  Patient presents with  . Sinusitis    Sinus pressure with ear pain, cough, congestion. Brother in-law was positive last Friday. Needs to be tested. Her symptoms started last Tuesday.    Virtual consult for illness symptoms.  She notes 6 day hx/o sinus pressure, ear pain, cough, congestion, feels like ear and sinus infection .  Did televisit with doctors on demand this past Friday 3 days ago.   Was given prednisone and tessalon Perles cough drops.  Using the [facility] and has some improvement.   Taking mucinex DM as well.  Has used some OTC tylenol for headache a few days.   No NVD.  Has had some chills early on but no fever, no body aches.  No loss of taste or smell.  Her brother in law was around her last week for Christmas, and he tested positive few days ago.   Her son who lives with her also got tested last week and he is awaiting results, he  has symptoms.    Julia Romero has not had a test for covid test.   Has always had issues with her ears.   Has hx/o TM tubes as a kid and has had infections as adult with ears.   She has had vaccine and booster for covid, had covid infection last christmas 2020.    I reviewed her medication, medical history and allergies.      Past Medical History:  Diagnosis Date  . Allergy    RHINITIS  . Contraception management    sees gynecology/ in the past  . Herpes labialis   . Hypertension      Review of Systems As in subjective    Objective:   Physical Exam Due to coronavirus pandemic stay at home measures, patient visit was virtual and they were not examined in person.   Ht 5\' 6"  (1.676 m)   Wt 200 lb (90.7 kg)   LMP 03/19/2014   BMI 32.28 kg/m       Assessment:     Encounter Diagnoses  Name Primary?  . Cough Yes  . Sinus pressure   . Contact with and (suspected) exposure to covid-19        Plan:     We discussed her symptoms and concerns.  She is on day 6 of symptoms.  Her employer is requiring testing so she will come in our back parking lot for Covid testing today.  She  is fully vaccinated and had Covid last Christmas.  Continue supportive care including rest, hydration, over-the-counter Mucinex, Tylenol for pain, and finish out the prescription was given by the online televisit a few days ago.  Advised nasal saline flush.  She is using her Flonase without improvement  We discussed quarantine timeframe if she test positive.  If negative, consider antibiotic given the ear pain, for possible ear infection.  If not improving or much worse in the coming few days then call back or recheck   Addendum: She came to our back parking lot and was negative for rapid antigen test for Covid.  I examined her ears.  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  Begin Augmentin  as below   Julia Romero was seen today for sinusitis.  Diagnoses and all orders for this visit:  Cough -     POC COVID-19 BinaxNow; Future  Sinus pressure -     POC COVID-19 BinaxNow; Future  Contact with and (suspected) exposure to covid-19  Other orders -     amoxicillin-clavulanate (AUGMENTIN) 875-125 MG tablet; Take 1 tablet by mouth 2 (two) times daily.  f/u today in our back parking lot for covid exposure.

## 2020-10-21 LAB — HM PAP SMEAR: HM Pap smear: NEGATIVE

## 2020-10-25 LAB — PATHOLOGY REPORT - SCANNED: HPV Aptima: NEGATIVE

## 2020-12-22 ENCOUNTER — Encounter: Payer: 59 | Admitting: Family Medicine

## 2020-12-29 ENCOUNTER — Other Ambulatory Visit: Payer: Self-pay

## 2020-12-29 ENCOUNTER — Encounter: Payer: Self-pay | Admitting: Family Medicine

## 2020-12-29 ENCOUNTER — Ambulatory Visit (INDEPENDENT_AMBULATORY_CARE_PROVIDER_SITE_OTHER): Payer: 59 | Admitting: Family Medicine

## 2020-12-29 VITALS — BP 130/82 | HR 83 | Temp 99.0°F | Ht 65.0 in | Wt 193.2 lb

## 2020-12-29 DIAGNOSIS — I10 Essential (primary) hypertension: Secondary | ICD-10-CM | POA: Diagnosis not present

## 2020-12-29 DIAGNOSIS — Z7189 Other specified counseling: Secondary | ICD-10-CM

## 2020-12-29 DIAGNOSIS — J3089 Other allergic rhinitis: Secondary | ICD-10-CM

## 2020-12-29 DIAGNOSIS — E785 Hyperlipidemia, unspecified: Secondary | ICD-10-CM | POA: Diagnosis not present

## 2020-12-29 DIAGNOSIS — Z8616 Personal history of COVID-19: Secondary | ICD-10-CM | POA: Diagnosis not present

## 2020-12-29 DIAGNOSIS — E669 Obesity, unspecified: Secondary | ICD-10-CM

## 2020-12-29 DIAGNOSIS — Z0000 Encounter for general adult medical examination without abnormal findings: Secondary | ICD-10-CM

## 2020-12-29 DIAGNOSIS — B001 Herpesviral vesicular dermatitis: Secondary | ICD-10-CM

## 2020-12-29 DIAGNOSIS — E66811 Obesity, class 1: Secondary | ICD-10-CM

## 2020-12-29 MED ORDER — MONTELUKAST SODIUM 10 MG PO TABS: 10.0000 mg | ORAL_TABLET | Freq: Every day | ORAL | 3 refills | Status: DC

## 2020-12-29 MED ORDER — IRBESARTAN 150 MG PO TABS: 150.0000 mg | ORAL_TABLET | Freq: Every day | ORAL | 3 refills | Status: DC

## 2020-12-29 MED ORDER — ATORVASTATIN CALCIUM 20 MG PO TABS: 20.0000 mg | ORAL_TABLET | Freq: Every day | ORAL | 4 refills | Status: DC

## 2020-12-29 NOTE — Progress Notes (Signed)
   Subjective:    Patient ID: Julia Romero, female    DOB: 05-27-1961, 60 y.o.   MRN: [id number]  HPI She is here for complete examination.  Of note is the fact that her mother died of cancer on 12-22-22.  She states that she is doing fairly well after her mother's death.  She also had COVID right before that.  She does have underlying allergies and has been using Zyrtec as well as double dosing of Flonase but still has some nasal symptoms.  She continues on Avapro for her blood pressure.  She does occasionally use Valtrex for herpes labialis.  She had a colonoscopy in 2020 and is not scheduled for follow-up until 2030.  She has seen her gynecologist and had a Pap and pelvic done she will will have a mammogram next year.  Her work and home life are going well.  She does have a 4 year old son at home.  This has created some problems especially since it sounds as if her husband is enabling the son to still stay at home and not be as responsive as she would think appropriate.  Otherwise her family and social history as well as health maintenance immunizations was reviewed Her exercise has been minimal especially due to her mother's recent death.  Review of Systems  All other systems reviewed and are negative.     Objective:   Physical Exam Alert and in no distress. Tympanic membranes and canals are normal. Pharyngeal area is normal. Neck is supple without adenopathy or thyromegaly. Cardiac exam shows a regular sinus rhythm without murmurs or gallops. Lungs are clear to auscultation. Abdominal exam shows no masses or tenderness.       Assessment & Plan:  Routine general medical examination at a health care facility - Plan: CBC with Differential/Platelet, Comprehensive metabolic panel, Lipid panel  Hyperlipidemia, unspecified hyperlipidemia type - Plan: atorvastatin (LIPITOR) 20 MG tablet  Non-seasonal allergic rhinitis, unspecified trigger - Plan: montelukast (SINGULAIR) 10 MG tablet  Essential  hypertension - Plan: irbesartan (AVAPRO) 150 MG tablet  Obesity (BMI 30.0-34.9)  Bereavement counseling  History of COVID-19  Herpes labialis Place her on Lipitor.  We will draw blood on her in 2 months Discussed bereavement counseling with her in hospice but at this point she does not think is necessary. Encouraged her to get involved in a regular exercise program. She will call when she needs a refill on her Valtrex. I will try her on Singulair to see if that will help with her allergies.  May possibly consider referring her to ENT if she continues having difficulty.

## 2021-01-21 ENCOUNTER — Other Ambulatory Visit: Payer: Self-pay | Admitting: Family Medicine

## 2021-01-21 DIAGNOSIS — B001 Herpesviral vesicular dermatitis: Secondary | ICD-10-CM

## 2021-01-21 NOTE — Telephone Encounter (Signed)
[facility] is requesting to fill pt valtrex. Please advise [facility] 

## 2021-03-03 ENCOUNTER — Other Ambulatory Visit (INDEPENDENT_AMBULATORY_CARE_PROVIDER_SITE_OTHER): Payer: 59

## 2021-03-03 ENCOUNTER — Other Ambulatory Visit: Payer: Self-pay

## 2021-03-03 DIAGNOSIS — Z0000 Encounter for general adult medical examination without abnormal findings: Secondary | ICD-10-CM

## 2021-03-04 LAB — COMPREHENSIVE METABOLIC PANEL
ALT: 22 IU/L (ref 0–32)
AST: 33 IU/L (ref 0–40)
Albumin/Globulin Ratio: 2.2 (ref 1.2–2.2)
Albumin: 4.8 g/dL (ref 3.8–4.9)
Alkaline Phosphatase: 100 IU/L (ref 44–121)
BUN/Creatinine Ratio: 12 (ref 9–23)
BUN: 9 mg/dL (ref 6–24)
Bilirubin Total: 0.5 mg/dL (ref 0.0–1.2)
CO2: 27 mmol/L (ref 20–29)
Calcium: 9.6 mg/dL (ref 8.7–10.2)
Chloride: 101 mmol/L (ref 96–106)
Creatinine, Ser: 0.73 mg/dL (ref 0.57–1.00)
Globulin, Total: 2.2 g/dL (ref 1.5–4.5)
Glucose: 95 mg/dL (ref 65–99)
Potassium: 4.5 mmol/L (ref 3.5–5.2)
Sodium: 142 mmol/L (ref 134–144)
Total Protein: 7 g/dL (ref 6.0–8.5)
eGFR: 95 mL/min/{1.73_m2} (ref >59)

## 2021-03-04 LAB — CBC WITH DIFFERENTIAL/PLATELET
Basophils Absolute: 0 10*3/uL (ref 0.0–0.2)
Basos: 0 %
EOS (ABSOLUTE): 0.1 10*3/uL (ref 0.0–0.4)
Eos: 1 %
Hematocrit: 43.3 % (ref 34.0–46.6)
Hemoglobin: 14.2 g/dL (ref 11.1–15.9)
Immature Grans (Abs): 0 10*3/uL (ref 0.0–0.1)
Immature Granulocytes: 0 %
Lymphocytes Absolute: 2 10*3/uL (ref 0.7–3.1)
Lymphs: 33 %
MCH: 29.6 pg (ref 26.6–33.0)
MCHC: 32.8 g/dL (ref 31.5–35.7)
MCV: 90 fL (ref 79–97)
Monocytes Absolute: 0.6 10*3/uL (ref 0.1–0.9)
Monocytes: 10 %
Neutrophils Absolute: 3.4 10*3/uL (ref 1.4–7.0)
Neutrophils: 56 %
Platelets: 259 10*3/uL (ref 150–450)
RBC: 4.8 x10E6/uL (ref 3.77–5.28)
RDW: 13 % (ref 11.7–15.4)
WBC: 6 10*3/uL (ref 3.4–10.8)

## 2021-03-04 LAB — LIPID PANEL
Chol/HDL Ratio: 3.2 ratio (ref 0.0–4.4)
Cholesterol, Total: 186 mg/dL (ref 100–199)
HDL: 59 mg/dL (ref >39)
LDL Chol Calc (NIH): 101 mg/dL — ABNORMAL HIGH (ref 0–99)
Triglycerides: 149 mg/dL (ref 0–149)
VLDL Cholesterol Cal: 26 mg/dL (ref 5–40)

## 2021-03-06 ENCOUNTER — Other Ambulatory Visit: Payer: Self-pay | Admitting: Family Medicine

## 2021-03-06 DIAGNOSIS — J3089 Other allergic rhinitis: Secondary | ICD-10-CM

## 2021-03-16 ENCOUNTER — Other Ambulatory Visit: Payer: Self-pay | Admitting: Family Medicine

## 2021-03-16 DIAGNOSIS — J3089 Other allergic rhinitis: Secondary | ICD-10-CM

## 2021-10-22 LAB — HM MAMMOGRAPHY

## 2021-10-29 ENCOUNTER — Ambulatory Visit: Payer: 59 | Admitting: Family Medicine

## 2021-10-29 ENCOUNTER — Encounter: Payer: Self-pay | Admitting: Family Medicine

## 2021-10-29 VITALS — BP 128/74 | HR 70 | Temp 98.4°F | Wt 198.0 lb

## 2021-10-29 DIAGNOSIS — M542 Cervicalgia: Secondary | ICD-10-CM | POA: Diagnosis not present

## 2021-10-29 MED ORDER — PREDNISONE 20 MG PO TABS: 60.0000 mg | ORAL_TABLET | Freq: Every day | ORAL | 0 refills | Status: DC

## 2021-10-29 NOTE — Progress Notes (Signed)
? ?  Subjective:  ? ? Patient ID: Julia Romero, female    DOB: Dec 17, 1960, 61 y.o.   MRN: [id number] ? ?HPI ?She complains of a 3-week history of neck pain with radiation down the right shoulder.  She notes that it also goes all the way down to her fingers and points to the thumb index and large finger.  When she bends her neck posteriorly he can make the symptoms worse.  She notes no weakness.  She does do a lot of typing and recently has noted the pain in the median nerve distribution. ? ? ?Review of Systems ? ?   ?Objective:  ? Physical Exam ?Pain on extension of the neck with radiation down her shoulder.  Spurling test is positive with radiation into the forearm.  Normal motor and sensory.  DTRs normal.  Tinel's and Phalen's test was negative. ? ? ? ?   ?Assessment & Plan:  ?Neck pain, acute - Plan: DG Cervical Spine Complete, predniSONE (DELTASONE) 20 MG tablet ?I explained that I thought this was a pinched nerve in her neck.  We will do x-rays to ensure no bony abnormalities, place her on steroid Dosepak.  If no improvement discussed possible MRI and further evaluation including possible epidurals.  She is to return here in 2 weeks for a recheck. ? ?

## 2021-10-30 ENCOUNTER — Ambulatory Visit
Admission: RE | Admit: 2021-10-30 | Discharge: 2021-10-30 | Disposition: A | Discharge status: Home / Self Care | Payer: 59 | Source: Ambulatory Visit | Attending: Family Medicine | Admitting: Family Medicine

## 2021-10-30 ENCOUNTER — Telehealth: Payer: Self-pay | Admitting: Family Medicine

## 2021-10-30 NOTE — Telephone Encounter (Signed)
Pt has questions about prednisone rx.. she thought you told her she would be on a tapering dose but her bottle just say to take 3 daily ? ?Please call ?

## 2021-11-02 MED ORDER — PREDNISONE 10 MG (48) PO TBPK: ORAL_TABLET | ORAL | 0 refills | Status: DC

## 2021-11-02 NOTE — Addendum Note (Signed)
Addended by: Denita Lung on: 11/02/2021 10:03 AM ? ? Modules accepted: Orders ? ?

## 2021-11-02 NOTE — Telephone Encounter (Signed)
Lvm for pt and also advised to call back if she has any questions. [place] ?

## 2021-11-02 NOTE — Telephone Encounter (Signed)
Pt was advised KH 

## 2021-11-12 ENCOUNTER — Encounter: Payer: Self-pay | Admitting: Family Medicine

## 2021-11-12 ENCOUNTER — Ambulatory Visit: Payer: 59 | Admitting: Family Medicine

## 2021-11-12 VITALS — BP 110/68 | HR 77 | Temp 98.2°F | Wt 199.2 lb

## 2021-11-12 DIAGNOSIS — M542 Cervicalgia: Secondary | ICD-10-CM | POA: Diagnosis not present

## 2021-11-12 NOTE — Progress Notes (Signed)
? ?  Subjective:  ? ? Patient ID: Julia Romero, female    DOB: 1961-06-03, 61 y.o.   MRN: [id number] ? ?HPI ?She is here for recheck.  She states that she is no longer in pain but is still having some tingling and sensation down her arm. ? ? ?Review of Systems ? ?   ?Objective:  ? Physical Exam ?Alert and in no distress.  Normal motor, sensory and DTRs as noted. ? ? ? ?   ?Assessment & Plan:  ?Neck pain, acute ?Since she is doing quite nicely, no further intervention is necessary.  Did discuss the tingling sensation in and hopefully this will go away with time. ? ?

## 2021-12-11 ENCOUNTER — Other Ambulatory Visit: Payer: Self-pay | Admitting: Family Medicine

## 2021-12-11 DIAGNOSIS — I10 Essential (primary) hypertension: Secondary | ICD-10-CM

## 2021-12-30 ENCOUNTER — Encounter: Payer: Self-pay | Admitting: Family Medicine

## 2021-12-30 ENCOUNTER — Ambulatory Visit: Payer: 59 | Admitting: Family Medicine

## 2021-12-30 VITALS — BP 112/70 | HR 77 | Temp 98.6°F | Ht 65.25 in | Wt 199.2 lb

## 2021-12-30 DIAGNOSIS — Z860100 Personal history of colon polyps, unspecified: Secondary | ICD-10-CM

## 2021-12-30 DIAGNOSIS — Z0000 Encounter for general adult medical examination without abnormal findings: Secondary | ICD-10-CM

## 2021-12-30 DIAGNOSIS — Z8616 Personal history of COVID-19: Secondary | ICD-10-CM

## 2021-12-30 DIAGNOSIS — E785 Hyperlipidemia, unspecified: Secondary | ICD-10-CM | POA: Diagnosis not present

## 2021-12-30 DIAGNOSIS — E669 Obesity, unspecified: Secondary | ICD-10-CM

## 2021-12-30 DIAGNOSIS — J3089 Other allergic rhinitis: Secondary | ICD-10-CM

## 2021-12-30 DIAGNOSIS — I10 Essential (primary) hypertension: Secondary | ICD-10-CM | POA: Diagnosis not present

## 2021-12-30 DIAGNOSIS — E66811 Obesity, class 1: Secondary | ICD-10-CM

## 2021-12-30 MED ORDER — IRBESARTAN 150 MG PO TABS: 150.0000 mg | ORAL_TABLET | Freq: Every day | ORAL | 3 refills | Status: DC

## 2021-12-30 MED ORDER — MONTELUKAST SODIUM 10 MG PO TABS: 10.0000 mg | ORAL_TABLET | Freq: Every day | ORAL | 3 refills | Status: DC

## 2021-12-30 MED ORDER — ATORVASTATIN CALCIUM 20 MG PO TABS: 20.0000 mg | ORAL_TABLET | Freq: Every day | ORAL | 3 refills | Status: DC

## 2021-12-30 NOTE — Progress Notes (Signed)
   Subjective:    Patient ID: Julia Romero, female    DOB: 12-Oct-1960, 61 y.o.   MRN: [id number]  HPI She is here for complete examination.  She has hypertension as well as hyperlipidemia and is taking Avapro as well as Lipitor.  Having no difficulty with these medications.  She does have underlying allergies and does use Flonase, Zyrtec and she is also taking Singulair.  She does have a history of polyps colonoscopy the most recent one was negative.  She does get regular Pap and mammograms.  She does see her gynecologist regularly.  She does not smoke but does drink sometimes as many as 3/day but does not do this on a regular basis.  Work and home life are going well.  She is not involved in a regular exercise program.  Otherwise her family and social history as well as health maintenance and immunizations were reviewed.   Review of Systems  All other systems reviewed and are negative.      Objective:   Physical Exam Alert and in no distress. Tympanic membranes and canals are normal. Pharyngeal area is normal. Neck is supple without adenopathy or thyromegaly. Cardiac exam shows a regular sinus rhythm without murmurs or gallops. Lungs are clear to auscultation.        Assessment & Plan:  Routine general medical examination at a health care facility - Plan: CBC with Differential/Platelet, Comprehensive metabolic panel, Lipid panel  Hyperlipidemia, unspecified hyperlipidemia type - Plan: Lipid panel, atorvastatin (LIPITOR) 20 MG tablet  Obesity (BMI 30.0-34.9)  Essential hypertension - Plan: irbesartan (AVAPRO) 150 MG tablet  Non-seasonal allergic rhinitis, unspecified trigger - Plan: montelukast (SINGULAIR) 10 MG tablet  History of colonic polyps  History of COVID-19 I encouraged her to get involved in a regular exercise program.  Also discussed cutting down on alcohol consumption to no more than 1/day.  Continue on present medication regimen.  Also discussed immunizations in regard  to shingles and eventually pneumonia shots.

## 2021-12-31 LAB — CBC WITH DIFFERENTIAL/PLATELET
Basophils Absolute: 0 10*3/uL (ref 0.0–0.2)
Basos: 0 %
EOS (ABSOLUTE): 0.1 10*3/uL (ref 0.0–0.4)
Eos: 1 %
Hematocrit: 40 % (ref 34.0–46.6)
Hemoglobin: 13.3 g/dL (ref 11.1–15.9)
Immature Grans (Abs): 0 10*3/uL (ref 0.0–0.1)
Immature Granulocytes: 0 %
Lymphocytes Absolute: 2.1 10*3/uL (ref 0.7–3.1)
Lymphs: 33 %
MCH: 30 pg (ref 26.6–33.0)
MCHC: 33.3 g/dL (ref 31.5–35.7)
MCV: 90 fL (ref 79–97)
Monocytes Absolute: 0.5 10*3/uL (ref 0.1–0.9)
Monocytes: 8 %
Neutrophils Absolute: 3.6 10*3/uL (ref 1.4–7.0)
Neutrophils: 58 %
Platelets: 236 10*3/uL (ref 150–450)
RBC: 4.44 x10E6/uL (ref 3.77–5.28)
RDW: 13.1 % (ref 11.7–15.4)
WBC: 6.3 10*3/uL (ref 3.4–10.8)

## 2021-12-31 LAB — COMPREHENSIVE METABOLIC PANEL
ALT: 20 IU/L (ref 0–32)
AST: 28 IU/L (ref 0–40)
Albumin/Globulin Ratio: 2.2 (ref 1.2–2.2)
Albumin: 4.7 g/dL (ref 3.8–4.9)
Alkaline Phosphatase: 95 IU/L (ref 44–121)
BUN/Creatinine Ratio: 11 (ref 9–23)
BUN: 8 mg/dL (ref 6–24)
Bilirubin Total: 0.7 mg/dL (ref 0.0–1.2)
CO2: 24 mmol/L (ref 20–29)
Calcium: 9.2 mg/dL (ref 8.7–10.2)
Chloride: 100 mmol/L (ref 96–106)
Creatinine, Ser: 0.71 mg/dL (ref 0.57–1.00)
Globulin, Total: 2.1 g/dL (ref 1.5–4.5)
Glucose: 95 mg/dL (ref 70–99)
Potassium: 4.6 mmol/L (ref 3.5–5.2)
Sodium: 140 mmol/L (ref 134–144)
Total Protein: 6.8 g/dL (ref 6.0–8.5)
eGFR: 98 mL/min/{1.73_m2} (ref >59)

## 2021-12-31 LAB — LIPID PANEL
Chol/HDL Ratio: 2.9 ratio (ref 0.0–4.4)
Cholesterol, Total: 174 mg/dL (ref 100–199)
HDL: 60 mg/dL (ref >39)
LDL Chol Calc (NIH): 98 mg/dL (ref 0–99)
Triglycerides: 87 mg/dL (ref 0–149)
VLDL Cholesterol Cal: 16 mg/dL (ref 5–40)

## 2022-02-12 ENCOUNTER — Other Ambulatory Visit: Payer: Self-pay | Admitting: Family Medicine

## 2022-02-12 DIAGNOSIS — B001 Herpesviral vesicular dermatitis: Secondary | ICD-10-CM

## 2022-03-24 ENCOUNTER — Encounter: Payer: Self-pay | Admitting: Internal Medicine

## 2022-04-05 ENCOUNTER — Encounter: Payer: Self-pay | Admitting: Family Medicine

## 2022-04-05 ENCOUNTER — Telehealth: Payer: 59 | Admitting: Family Medicine

## 2022-04-05 VITALS — Ht 66.0 in | Wt 199.0 lb

## 2022-04-05 DIAGNOSIS — R0981 Nasal congestion: Secondary | ICD-10-CM | POA: Diagnosis not present

## 2022-04-05 NOTE — Progress Notes (Signed)
   Subjective:    Patient ID: Julia Romero, female    DOB: 1960/12/14, 61 y.o.   MRN: [id number]  HPI Documentation for virtual audio and video telecommunications through [facility] encounter: The patient was located at home. 2 patient identifiers used.  The provider was located in the office. The patient did consent to this visit and is aware of possible charges through their insurance for this visit. The other persons participating in this telemedicine service were none. Time spent on call was 5 minutes and in review of previous records >20 minutes total for counseling and coordination of care. This virtual service is not related to other E/M service within previous 7 days.  Last Friday she developed sinus congestion followed Saturday by worsening congestion, slight cough, postnasal drainage with slight sore throat and now some slight decreased hearing in the left ear.  She did do a COVID test yesterday which was negative.  Review of Systems     Objective:   Physical Exam Alert and in no distress with hoarse voice.       Assessment & Plan:  Sinus congestion I explained that she is probably having viral type of upper respiratory infection with the sinus congestion in the left ear difficulty.  She is to give it a couple more days and if she gets worse I will take a look at her so I can see what the tympanic membrane look like.  She was comfortable with that.  She is to continue with Mucinex DM and NyQuil at night.  Also recommend Afrin nasal spray but only at night.

## 2022-04-27 ENCOUNTER — Encounter: Payer: Self-pay | Admitting: Internal Medicine

## 2022-05-10 ENCOUNTER — Encounter: Payer: Self-pay | Admitting: Internal Medicine

## 2022-06-08 ENCOUNTER — Telehealth: Payer: Self-pay | Admitting: Family Medicine

## 2022-06-08 MED ORDER — AZELASTINE HCL 0.1 % NA SOLN
2.0000 | Freq: Two times a day (BID) | NASAL | 12 refills | Status: DC
Start: 2022-06-08 — End: 2023-01-05

## 2022-06-08 NOTE — Telephone Encounter (Signed)
Pt called and wants refill of Astelin nasal spray.  She said you offered before but she could not afford.  She would like now.

## 2022-10-28 LAB — HM MAMMOGRAPHY

## 2022-11-04 ENCOUNTER — Other Ambulatory Visit: Payer: Self-pay | Admitting: Family Medicine

## 2022-11-04 DIAGNOSIS — I10 Essential (primary) hypertension: Secondary | ICD-10-CM

## 2022-12-14 ENCOUNTER — Other Ambulatory Visit: Payer: Self-pay | Admitting: Family Medicine

## 2022-12-14 DIAGNOSIS — E785 Hyperlipidemia, unspecified: Secondary | ICD-10-CM

## 2022-12-14 DIAGNOSIS — J3089 Other allergic rhinitis: Secondary | ICD-10-CM

## 2022-12-15 NOTE — Telephone Encounter (Signed)
Has an appointment  on 6/19 with Dr. Susann Givens

## 2022-12-28 ENCOUNTER — Other Ambulatory Visit: Payer: Self-pay | Admitting: Family Medicine

## 2022-12-28 DIAGNOSIS — J3089 Other allergic rhinitis: Secondary | ICD-10-CM

## 2023-01-03 ENCOUNTER — Encounter: Payer: 59 | Admitting: Family Medicine

## 2023-01-05 ENCOUNTER — Ambulatory Visit: Payer: 59 | Admitting: Family Medicine

## 2023-01-05 ENCOUNTER — Encounter: Payer: Self-pay | Admitting: Family Medicine

## 2023-01-05 VITALS — BP 142/92 | HR 82 | Temp 97.5°F | Resp 16 | Ht 65.5 in | Wt 200.0 lb

## 2023-01-05 DIAGNOSIS — Z860100 Personal history of colon polyps, unspecified: Secondary | ICD-10-CM

## 2023-01-05 DIAGNOSIS — B001 Herpesviral vesicular dermatitis: Secondary | ICD-10-CM | POA: Diagnosis not present

## 2023-01-05 DIAGNOSIS — I10 Essential (primary) hypertension: Secondary | ICD-10-CM | POA: Diagnosis not present

## 2023-01-05 DIAGNOSIS — E66811 Obesity, class 1: Secondary | ICD-10-CM

## 2023-01-05 DIAGNOSIS — E669 Obesity, unspecified: Secondary | ICD-10-CM

## 2023-01-05 DIAGNOSIS — J3089 Other allergic rhinitis: Secondary | ICD-10-CM

## 2023-01-05 DIAGNOSIS — Z0000 Encounter for general adult medical examination without abnormal findings: Secondary | ICD-10-CM | POA: Diagnosis not present

## 2023-01-05 DIAGNOSIS — E785 Hyperlipidemia, unspecified: Secondary | ICD-10-CM

## 2023-01-05 DIAGNOSIS — Z8616 Personal history of COVID-19: Secondary | ICD-10-CM

## 2023-01-05 LAB — COMPREHENSIVE METABOLIC PANEL

## 2023-01-05 LAB — CBC WITH DIFFERENTIAL/PLATELET
Basos: 0 %
Hematocrit: 41.5 % (ref 34.0–46.6)
Immature Granulocytes: 0 %
Monocytes Absolute: 0.6 10*3/uL (ref 0.1–0.9)

## 2023-01-05 LAB — LIPID PANEL

## 2023-01-05 MED ORDER — MONTELUKAST SODIUM 10 MG PO TABS: 10.0000 mg | ORAL_TABLET | Freq: Every day | ORAL | 0 refills | Status: DC

## 2023-01-05 MED ORDER — IRBESARTAN 150 MG PO TABS: 150.0000 mg | ORAL_TABLET | Freq: Every day | ORAL | 0 refills | Status: DC

## 2023-01-05 MED ORDER — VALACYCLOVIR HCL 1 G PO TABS: 1000.0000 mg | ORAL_TABLET | Freq: Two times a day (BID) | ORAL | 1 refills | Status: DC

## 2023-01-05 MED ORDER — AZELASTINE HCL 0.1 % NA SOLN
2.0000 | Freq: Two times a day (BID) | NASAL | 12 refills | Status: DC
Start: 2023-01-05 — End: 2023-12-08

## 2023-01-05 MED ORDER — ATORVASTATIN CALCIUM 20 MG PO TABS: 20.0000 mg | ORAL_TABLET | Freq: Every day | ORAL | 0 refills | Status: DC

## 2023-01-05 NOTE — Progress Notes (Signed)
Complete physical exam  Patient: Julia Romero   DOB: March 25, 1961   62 y.o. Female  MRN: [id number]  Subjective:    Chief Complaint  Patient presents with   Annual Exam    Fasting. Wants to also discuss concerns with difficulty swallowing and acid reflux.    Julia Romero is a 62 y.o. female who presents today for a complete physical exam. She reports consuming a general diet. Gym/ health club routine includes 3-5 days each week. She generally feels fairly well. She reports sleeping poorly. She does have additional problems to discuss today (trouble swallowing and acid reflux) she gives a history of occasionally having food get stuck in her throat.  She thinks it is mainly solid foods but is not really sure but did note that bread did cause this.  She has not noted any other particular kinds of foods.  It is intermittent in nature.  She does have underlying allergies and needs her nasal spray as well as Singulair renewed.  Continues on Lipitor as well as irbesartan and not having any difficulty with that.  She would also like her Valtrex renewed for treatment of herpes labialis.  She does see her gynecologist regularly.  Otherwise she has no particular concerns or complaints.  Family and social history as well as health maintenance and immunizations was reviewed.   Most recent fall risk assessment:    01/05/2023    2:45 PM  Fall Risk   Falls in the past year? 0  Number falls in past yr: 0  Injury with Fall? 0  Risk for fall due to : No Fall Risks  Follow up Follow up appointment     Most recent depression screenings:    01/05/2023    2:45 PM 12/30/2021    3:11 PM  PHQ 2/9 Scores  PHQ - 2 Score 3 0  PHQ- 9 Score 5     Vision:Within last year and Dental: Receives regular dental care    Patient Care Team: Ronnald Nian, MD as PCP - General (Family Medicine)   Outpatient Medications Prior to Visit  Medication Sig   atorvastatin (LIPITOR) 20 MG tablet TAKE 1 TABLET BY MOUTH  DAILY   azelastine (ASTELIN) 0.1 % nasal spray Place 2 sprays into both nostrils 2 (two) times daily. Use in each nostril as directed   Biotin 5000 MCG SUBL Place 10,000 mcg under the tongue daily.   fluocinonide gel (LIDEX) 0.05 % fluocinonide 0.05 % topical gel  APPLY SPARINGLY TO THE AFFECTED AREA THREE TIMES DAILY UNTIL SYMPTOMS ARE RESOLVED.   fluticasone (FLONASE) 50 MCG/ACT nasal spray PLACE 1 SPRAY INTO BOTH NOSTRILS DAILY.   irbesartan (AVAPRO) 150 MG tablet TAKE 1 TABLET BY MOUTH DAILY   montelukast (SINGULAIR) 10 MG tablet TAKE 1 TABLET BY MOUTH AT  BEDTIME   TURMERIC PO Take 1,400 mg by mouth daily. Take 2 pills  a day   valACYclovir (VALTREX) 1000 MG tablet TAKE 1 TABLET (1,000 MG TOTAL) BY MOUTH 2 (TWO) TIMES DAILY. FOR ONE DAY   [DISCONTINUED] amoxicillin-clavulanate (AUGMENTIN) 875-125 MG tablet Take 1 tablet by mouth 2 (two) times daily. (Patient not taking: Reported on 12/29/2020)   [DISCONTINUED] benzonatate (TESSALON) 100 MG capsule Take 100 mg by mouth 3 (three) times daily. (Patient not taking: Reported on 12/29/2020)   [DISCONTINUED] cetirizine (ZYRTEC) 10 MG tablet TAKE 1 TABLET BY MOUTH  DAILY (Patient not taking: Reported on 11/12/2021)   [DISCONTINUED] predniSONE (STERAPRED UNI-PAK 48 TAB) 10 MG (  48) TBPK tablet Take as per manufacturer's recommendations (Patient not taking: Reported on 11/12/2021)   No facility-administered medications prior to visit.    Review of Systems  All other systems reviewed and are negative.         Objective:  Alert and in no distress. Tympanic membranes and canals are normal. Pharyngeal area is normal. Neck is supple without adenopathy or thyromegaly. Cardiac exam shows a regular sinus rhythm without murmurs or gallops. Lungs are clear to auscultation.    BP (!) 142/92 (BP Location: Right Arm, Cuff Size: Large)   Pulse 82   Temp (!) 97.5 F (36.4 C) (Oral)   Resp 16   Ht 5' 5.5" (1.664 m)   Wt 200 lb (90.7 kg)   LMP 03/19/2014    SpO2 98% Comment: room air  BMI 32.78 kg/m    Physical Exam   No results found for any visits on 01/05/23.     Assessment & Plan:    Routine general medical examination at a health care facility - Plan: CBC with Differential/Platelet, Comprehensive metabolic panel, Lipid panel  Non-seasonal allergic rhinitis, unspecified trigger - Plan: montelukast (SINGULAIR) 10 MG tablet, azelastine (ASTELIN) 0.1 % nasal spray  Essential hypertension - Plan: irbesartan (AVAPRO) 150 MG tablet, CBC with Differential/Platelet, Comprehensive metabolic panel  Herpes labialis - Plan: valACYclovir (VALTREX) 1000 MG tablet  Obesity (BMI 30.0-34.9)  History of colonic polyps  Hyperlipidemia, unspecified hyperlipidemia type - Plan: atorvastatin (LIPITOR) 20 MG tablet, Lipid panel  History of COVID-19  Immunization History  Administered Date(s) Administered   DTaP 04/06/1995, 07/23/2005   Influenza,inj,Quad PF,6+ Mos 04/16/2013, 05/09/2014, 07/07/2015   PFIZER(Purple Top)SARS-COV-2 Vaccination 09/17/2019, 10/15/2019, 05/05/2020   PPD Test 04/06/1995   Tdap 07/23/2005, 10/13/2016    Health Maintenance  Topic Date Due   HIV Screening  Never done   COVID-19 Vaccine (4 - 2023-24 season) 03/19/2022   Zoster Vaccines- Shingrix (1 of 2) 01/29/2023 (Originally 10/18/1981)   INFLUENZA VACCINE  02/17/2023   PAP SMEAR-Modifier  10/22/2023   MAMMOGRAM  10/27/2024   DTaP/Tdap/Td (5 - Td or Tdap) 10/14/2026   Colonoscopy  07/19/2029   Hepatitis C Screening  Completed   HPV VACCINES  Aged Out    Discussed the difficulty with swallowing and recommend she pay attention to whether was liquids or solids and the solids that there is no particular ones that cause more trouble than another.  Her medications were renewed.  Discussed Shingrix vaccine with her but she would like to hold off on that. Problem List Items Addressed This Visit     Allergic rhinitis - Primary   Essential hypertension   Herpes  labialis   History of colonic polyps   History of COVID-19   Hyperlipidemia   Obesity (BMI 30.0-34.9)   No follow-ups on file.     Sharlot Gowda, MD

## 2023-01-06 LAB — CBC WITH DIFFERENTIAL/PLATELET
Basophils Absolute: 0 10*3/uL (ref 0.0–0.2)
EOS (ABSOLUTE): 0.1 10*3/uL (ref 0.0–0.4)
Eos: 2 %
Hemoglobin: 13.8 g/dL (ref 11.1–15.9)
Immature Grans (Abs): 0 10*3/uL (ref 0.0–0.1)
Lymphocytes Absolute: 1.9 10*3/uL (ref 0.7–3.1)
Lymphs: 31 %
MCH: 29.4 pg (ref 26.6–33.0)
MCHC: 33.3 g/dL (ref 31.5–35.7)
MCV: 89 fL (ref 79–97)
Monocytes: 9 %
Neutrophils Absolute: 3.5 10*3/uL (ref 1.4–7.0)
Neutrophils: 58 %
Platelets: 235 10*3/uL (ref 150–450)
RBC: 4.69 x10E6/uL (ref 3.77–5.28)
RDW: 13.2 % (ref 11.7–15.4)
WBC: 6.1 10*3/uL (ref 3.4–10.8)

## 2023-01-06 LAB — COMPREHENSIVE METABOLIC PANEL
ALT: 53 IU/L — ABNORMAL HIGH (ref 0–32)
AST: 58 IU/L — ABNORMAL HIGH (ref 0–40)
Albumin: 4.4 g/dL (ref 3.8–4.9)
Alkaline Phosphatase: 111 IU/L (ref 44–121)
Bilirubin Total: 0.5 mg/dL (ref 0.0–1.2)
CO2: 25 mmol/L (ref 20–29)
Calcium: 9.7 mg/dL (ref 8.7–10.3)
Globulin, Total: 2.4 g/dL (ref 1.5–4.5)
Glucose: 96 mg/dL (ref 70–99)
Potassium: 4.2 mmol/L (ref 3.5–5.2)
Sodium: 140 mmol/L (ref 134–144)

## 2023-01-06 LAB — LIPID PANEL
Chol/HDL Ratio: 2.8 ratio (ref 0.0–4.4)
Cholesterol, Total: 181 mg/dL (ref 100–199)
HDL: 64 mg/dL (ref >39)
Triglycerides: 85 mg/dL (ref 0–149)
VLDL Cholesterol Cal: 16 mg/dL (ref 5–40)

## 2023-01-07 ENCOUNTER — Ambulatory Visit (INDEPENDENT_AMBULATORY_CARE_PROVIDER_SITE_OTHER): Payer: 59 | Admitting: Licensed Clinical Social Worker

## 2023-01-07 DIAGNOSIS — F331 Major depressive disorder, recurrent, moderate: Secondary | ICD-10-CM

## 2023-01-07 DIAGNOSIS — F411 Generalized anxiety disorder: Secondary | ICD-10-CM | POA: Diagnosis not present

## 2023-01-07 NOTE — Progress Notes (Incomplete)
Evidence-based guidance:  Assess for presence of additional co-occurring psychiatric comorbidity [e.g., substance use, other anxiety disorder (specific phobia, social anxiety disorder, panic disorder, agoraphobia, substance or medically-induced   anxiety disorder)].  Assess for presence of medical comorbidity (e.g., chronic pain, chronic illness), recent or recurrent trauma or abuse, family history of substance use disorder or mental illness.  Screen for anxiety using standardized, validated tool.  Move gradually from investigating somatic complaints to exploring social or psychologic distress.  Assess for signs and symptoms of anxiety in an atmosphere of hope and optimism.  Facilitate full diagnostic interview when positive screening results are noted; utilize DSM-5 criteria to determine appropriate diagnosis.  Screen for depression simultaneously due to the frequency of co-occurrence.   Notes: PT scored a 17 on the GAD-ANXIETY evaluation indicating severe anxiety. PT reports that she has been anxious about the situation with her son and the effect it is having on the dynamic in the home. PT identified her goal of decreasing her anxiety through therapy and coping skills to assist her in remaining calm as well as decreasing distressing thoughts.  Subjective:   Julia Romero is an 62 y.o. female who presents for evaluation and treatment of depressive symptoms and anxiety. Onset approximately {number:19994} {units:19995} ago, {course:19996} since that time.  Current symptoms include {symptoms:20000}.  Current treatment for depression:{DEPRESSION TREATMENT:20002} Sleep problems: {(BH) ABSENT/MARKED:20013}   Early awakening:{(BH) ABSENT/MARKED:20013}   Energy: {GOOD/POOR:20011} Motivation: {GOOD/POOR:20011} Concentration: {GOOD/POOR:20011} Rumination/worrying: {(BH) ABSENT/MARKED:20013} Memory: {GOOD/POOR:20011} Tearfulness: {(BH) ABSENT/MARKED:20013}  Anxiety: {(BH) ABSENT/MARKED:20013}   Panic: {(BH) ABSENT/MARKED:20013}  Overall Mood: {(BH) RANGE IMPROVED /WORSE:20012}  Hopelessness: {(BH) ABSENT/MARKED:20013} Suicidal ideation: {(BH) ABSENT/MARKED:20017}  Other/Psychosocial Stressors: *** Family history positive for depression in the patient's {family members:19997}.  Previous treatment modalities employed include {treatment:20002}.  Past episodes of depression:*** Organic causes of depression present: {drug abuse:20001}.  Review of Systems {ros - complete:30496}   Objective:   Mental Status Examination: Posture and motor behavior: {:20006} Dress, grooming, personal hygiene: {:20006} Facial expression: {:20006} Speech: {:20006} Mood: {:20006} Coherency and relevance of thought: {:20006} Thought content: {:20006} Perceptions: {:20006} Orientation:{:20006} Attention and concentration: {:20006} Memory: : {:20006} Information: {neg/pos:19998} Vocabulary: {:20006} Abstract reasoning: {:20006} Judgment: {:20006}    Assessment:   Experiencing the following symptoms of depression most of the day nearly every day for more than two consecutive weeks: depressed mood, loss of interests/pleasure, change in sleep, loss of energy, trouble concentrating, thoughts of worthlessness or guilt, thoughts about death or suicide  Depressive Disorder:***  Suicide Risk Assessment:  Suicidal intent: *** Suicidal plan: *** Access to means for suicide: *** Lethality of means for suicide: *** Prior suicide attempts: *** Recent exposure to suicide:***   Plan:   Generalized anxiety disorder  Major depressive disorder, recurrent episode, moderate (HCC)  Reviewed concept of depression as biochemical imbalance of neurotransmitters and rationale for treatment. Instructed patient to contact office or on-call physician promptly should condition worsen or any new symptoms appear and provided on-call telephone numbers.   Evidence-based guidance:  Assess for presence of additional  co-occurring psychiatric comorbidity [e.g., substance use, other anxiety disorder (specific phobia, social anxiety disorder, panic disorder, agoraphobia, substance or medically-induced   anxiety disorder)].  Assess for presence of medical comorbidity (e.g., chronic pain, chronic illness), recent or recurrent trauma or abuse, family history of substance use disorder or mental illness.  Screen for anxiety using standardized, validated tool.  Move gradually from investigating somatic complaints to exploring social or psychologic distress.  Assess for signs and symptoms of anxiety in  an atmosphere of hope and optimism.  Facilitate full diagnostic interview when positive screening results are noted; utilize DSM-5 criteria to determine appropriate diagnosis.  Screen for depression simultaneously due to the frequency of co-occurrence.   Notes: PT scored a 17 on the GAD-ANXIETY evaluation indicating severe anxiety. PT reports that she has been anxious about the situation with her son and the effect it is having on the dynamic in the home. PT identified her goal of decreasing her anxiety through therapy and coping skills to assist her in remaining calm as well as decreasing distressing thoughts.                Julia Romero

## 2023-01-08 ENCOUNTER — Encounter: Payer: Self-pay | Admitting: Licensed Clinical Social Worker

## 2023-01-08 DIAGNOSIS — F411 Generalized anxiety disorder: Secondary | ICD-10-CM | POA: Insufficient documentation

## 2023-01-10 ENCOUNTER — Telehealth: Payer: Self-pay | Admitting: Family Medicine

## 2023-01-10 NOTE — Telephone Encounter (Signed)
Pt emailed form to be filled out from her cpe on from 01/05/2023, please give back to me once filled out, and I will email back to pt put in your folder to be completed

## 2023-01-10 NOTE — Progress Notes (Deleted)
[facility] Counselor/Therapist Progress Note  Patient ID: Julia Romero, MRN: [id number]    Date: 01/10/23  Time Spent: ***  {LBBHAMPM:26719} - *** {LBBHAMPM:26719} : *** Minutes  Treatment Type: Individual Therapy.  Reported Symptoms: ***  Mental Status Exam: Appearance:  {PSY:22683}     Behavior: {PSY:21022743}  Motor: {PSY:22302}  Speech/Language:  {PSY:22685}  Affect: {PSY:22687}  Mood: {PSY:31886}  Thought process: {PSY:31888}  Thought content:   {PSY:[phone]}  Sensory/Perceptual disturbances:   {[phone]}  Orientation: {PSY:30297}  Attention: {PSY:22877}  Concentration: {PSY:705-267-0671}  Memory: {PSY:8105143671}  Fund of knowledge:  {PSY:705-267-0671}  Insight:   {PSY:705-267-0671}  Judgment:  {PSY:705-267-0671}  Impulse Control: {PSY:705-267-0671}   Risk Assessment: Danger to Self:  {PSY:22692} Self-injurious Behavior: {PSY:22692} Danger to Others: {PSY:22692} Duty to Warn:{PSY:311194} Physical Aggression / Violence:{PSY:21197} Access to Firearms a concern: {PSY:21197} Gang Involvement:{PSY:21197}  Subjective:   Julia Romero participated from {Patient Location:26691::"home"}, via {LBBHVIDEOORPHONE:26720}, and consented to treatment. Therapist participated from {LBBHPROVIDERLOCATION:26721}. We met online due to COVID pandemic.   ***   Interventions: {[phone]}  Diagnosis: Generalized anxiety disorder  Major depressive disorder, recurrent episode, moderate (HCC)   Plan: ***Patient is to use CBT, mindfulness and coping skills to help manage decrease symptoms associated with their diagnosis.   Long-term goal:   ***Reduce overall level, frequency, and intensity of the feelings of depression, anxiety and panic evidenced by       decreased irritability, negative self talk, and helpless feelings from 6 to 7 days/week to 0 to 1 days/week per client report for at least 3 consecutive months.  Short-term goal:  ***Verbally express  understanding of the relationship between feelings of depression, anxiety and their impact on thinking patterns and behaviors. Verbalize an understanding of the role that distorted thinking plays in creating fears, excessive worry, and ruminations.  Phyllis Lizabeth MSW, LCSW/DATE

## 2023-01-10 NOTE — Progress Notes (Signed)
[facility] Counselor/Therapist Progress Note  Patient ID: Julia Romero, MRN: [id number]    Date: 01/10/23  Time Spent: 1000  am - 1100 am : 60 Minutes  Treatment Type: Individual Therapy.  Reported Symptoms: Depression, Anxiety   Mental Status Exam: Appearance:  Well Groomed     Behavior: Appropriate  Motor: Normal  Speech/Language:  Clear and Coherent  Affect: Flat  Mood: depressed  Thought process: normal  Thought content:   WNL  Sensory/Perceptual disturbances:   WNL  Orientation: oriented to person and place  Attention: Good  Concentration: Good  Memory: WNL  Fund of knowledge:  Good  Insight:   Fair  Judgment:  Good  Impulse Control: Good   Risk Assessment: Danger to Self:  No Self-injurious Behavior: No Danger to Others: No Duty to Warn:no Physical Aggression / Violence:No  Access to Firearms a concern: No  Gang Involvement:No   Subjective:   Julia Romero participated from home, via video, and consented to treatment. Therapist participated from home office. Her home located in [place] with appropriate measure taken to preserve patient confidentially;We met online due to patient request at time of setting her appointment.     Interventions: Cognitive Behavioral Therapy  Diagnosis: Generalized anxiety disorder  Major depressive disorder, recurrent episode, moderate (HCC)   Plan: Patient is to use CBT, mindfulness and coping skills to help manage decrease symptoms associated with their diagnosis.   Long-term goal:   Reduce overall level, frequency, and intensity of the feelings of depression, anxiety and panic evidenced by       decreased irritability, negative self talk, and helpless feelings from 6 to 7 days/week to 0 to 1 days/week per client report for at least 3 consecutive months.  Short-term goal:  Verbally express understanding of the relationship between feelings of depression, anxiety and their impact on thinking patterns  and behaviors. Verbalize an understanding of the role that distorted thinking plays in creating fears, excessive worry, and ruminations.  Phyllis Angelicia MSW, LCSW/DATE 01/07/2023

## 2023-01-21 ENCOUNTER — Ambulatory Visit (INDEPENDENT_AMBULATORY_CARE_PROVIDER_SITE_OTHER): Payer: 59 | Admitting: Licensed Clinical Social Worker

## 2023-01-21 DIAGNOSIS — F411 Generalized anxiety disorder: Secondary | ICD-10-CM

## 2023-01-21 NOTE — Progress Notes (Signed)
[facility] Counselor/Therapist Progress Note  Patient ID: Julia Romero, MRN: [id number]    Date: 01/23/23  Time Spent: [facility] Counselor/Therapist Progress Note  Patient ID: Julia Romero, MRN: [id number]    Date: 01/21/2023  Time Spent: 0900  am - 1000 am : 60 Minutes  Treatment Type: Individual Therapy.  Reported Symptoms: Symptoms of anxiety and depression related to son coming out as transgender. Concerns of marital conflict arising in relation to various ideas on how to approach the current stressors in the home.   Mental Status Exam: Appearance:  Casual     Behavior: Appropriate  Motor: Normal  Speech/Language:  Clear and Coherent  Affect: Appropriate  Mood: anxious  Thought process: normal  Thought content:   WNL  Sensory/Perceptual disturbances:   WNL  Orientation: oriented to person, place, time/date, and situation  Attention: Good  Concentration: Good  Memory: WNL  Fund of knowledge:  Good  Insight:   Good  Judgment:  Good  Impulse Control: Good   Risk Assessment: Danger to Self:  No Self-injurious Behavior: No Danger to Others: No Duty to Warn:no Physical Aggression / Violence:No  Access to Firearms a concern: No  Gang Involvement:No   Subjective:   Sherell L Dlugosz and her husband [name] participated from home, via video, and consented to treatment and recognized risk an dlimitations involved with virtual sessions.  Therapist participated from home office. We met online due to patient request.  Clinician met with Tyana and her husband [name] virtually. Patient and her husband were in good spirits and  voiced concerns as they attempt to discuss with their son his decision to transition. Both parents identified fears and concerns both with the upcoming discussion as well as concerns with the transition itself.   Interventions: Cognitive Behavioral Therapy  Diagnosis: Generalized Anxiety Disorder   Plan: Knox and  [name] are to use CBT, mindfulness and coping skills to help manage decrease symptoms associated with their diagnosis and individual concerns related to the current stressors.   Long-term goal:   Tamura and Michael Reduce overall level, frequency, and intensity of the feelings of depression, anxiety and panic. As well as fears and concerns related to the current situation, evidenced by decreased irritability, negative self talk, and helpless feelings from 6 to 7 days/week to 0 to 2 days/week per client report for at least 3 consecutive months.Goals and progress will be reviewed by 04/23/2023.  Short-term goal:  Maryann and [name] will verbally express understanding of the relationship between feelings of depression, anxiety and their  person fears and concerns impact on thinking patterns and behaviors. Verbalize an understanding of the role that distorted thinking plays in creating fears, excessive worry, and ruminations.  Phyllis Eiko MSW, LCSW/DATE 01/21/2023

## 2023-01-23 ENCOUNTER — Encounter: Payer: Self-pay | Admitting: Licensed Clinical Social Worker

## 2023-01-26 ENCOUNTER — Other Ambulatory Visit: Payer: Self-pay | Admitting: Family Medicine

## 2023-01-26 DIAGNOSIS — E785 Hyperlipidemia, unspecified: Secondary | ICD-10-CM

## 2023-02-04 ENCOUNTER — Ambulatory Visit (INDEPENDENT_AMBULATORY_CARE_PROVIDER_SITE_OTHER): Payer: 59 | Admitting: Licensed Clinical Social Worker

## 2023-02-04 DIAGNOSIS — F411 Generalized anxiety disorder: Secondary | ICD-10-CM

## 2023-02-06 ENCOUNTER — Encounter: Payer: Self-pay | Admitting: Licensed Clinical Social Worker

## 2023-02-06 NOTE — Progress Notes (Signed)
[facility] Counselor/Therapist Progress Note  Patient ID: Julia Romero, MRN: [id number]    Date: 02/06/23  Time Spent: 1000  am - 1100 am : 60 Minutes  Treatment Type: Family Therapy  Reported Symptoms: Symptoms of depression and anxiety and marital stress related to sons desire to transition.  Mental Status Exam: Appearance:  NA      Behavior: Appropriate  Motor: NA  Speech/Language:  Clear and Coherent  Affect: NA  Mood: anxious  Thought process: normal  Thought content:   WNL  Sensory/Perceptual disturbances:   WNL  Orientation: oriented to person, place, time/date, situation, day of week, month of year, and year  Attention: Good  Concentration: Good  Memory: WNL  Fund of knowledge:  Good  Insight:   Good  Judgment:  Good  Impulse Control: Good   Risk Assessment: Danger to Self:  No Self-injurious Behavior: No Danger to Others: No Duty to Warn:no Physical Aggression / Violence:No  Access to Firearms a concern: No  Gang Involvement:No   Subjective:   Julia Romero and spouse Julia Romero participated from home, via phone due to a nationwide outage for [employer]. Patient was made aware of risk and limitations and consented to treatment. Therapist participated from home office. We met via phone due to patient request for virtual sessions and the system being down nationwide.  Julia Romero and her spouse Julia Romero presented on time for their scheduled session. Both were fully engaged in discussion and shared their recent interaction details with their son with Clinician. Both identified that this has caused tension in the family dynamic and that Julia Romero feels that she is the enemy. Julia Romero is more open and willing to accept the situation and therefore their son and daughter both will speak with him more and Julia Romero stats that they ignore her and won't speak to her. Julia Romero and Julia Romero both identified the need to form a united front and not allow this to tear them apart while still  trying to be as supportive as possible to their children.   Interventions: Cognitive Behavioral Therapy  Diagnosis: No diagnosis found.   Plan: Julia Romero and Julia Romero are  to utilize CBT, mindfulness and coping skills to help manage decrease symptoms associated with their diagnosis. Evidence-based guidance:  Acknowledge, normalize and validate difficulty of making life-long lifestyle changes.  Identify current effective and ineffective coping strategies.  Encourage patient and caregiver participation in care to increase self-esteem, confidence and feelings of control.  Consider alternative and complementary therapy approaches such as meditation, mindfulness or yoga.  Encourage participation in cognitive behavioral therapy to foster a positive identity, increase self-awareness, as well as bolster self-esteem, confidence and self-efficacy.  Discuss spirituality; be present as concerns are identified; encourage journaling, prayer, worship services, meditation or pastoral counseling.  Encourage participation in pleasurable group activities such as hobbies, singing, sports or volunteering).  Encourage the use of mindfulness; refer for training or intensive intervention.  Consider the use of meditative movement therapy such as tai chi, yoga or qigong.  Promote a regular daily exercise program based on tolerance, ability and patient choice to support positive thinking about disease or aging.     Long-term goal:   Julia Romero and Julia Romero will reduce overall level, frequency, and intensity of the feelings of depression, anxiety and worry evidenced by decreased irritability, negative self talk, and helpless feelings from 6 to 7 days/week to 0 to 2 days/week per client report for at least 3 consecutive months with treatment plan being reviewed by 01/07/24.  Short-term goal:  Julia Romero and Julia Romero will verbally express understanding of the relationship between feelings of depression, anxiety and their impact on thinking  patterns and behaviors. Verbalize an understanding of the role that distorted thinking plays in creating fears, excessive worry, and ruminations.  Julia Romero MSW, LCSW Date: 02/04/23

## 2023-02-18 ENCOUNTER — Ambulatory Visit: Payer: 59 | Admitting: Licensed Clinical Social Worker

## 2023-02-18 DIAGNOSIS — F411 Generalized anxiety disorder: Secondary | ICD-10-CM

## 2023-02-18 DIAGNOSIS — F331 Major depressive disorder, recurrent, moderate: Secondary | ICD-10-CM

## 2023-02-18 NOTE — Progress Notes (Addendum)
[facility] Counselor/Therapist Progress Note  Patient ID: Julia Romero, MRN: [id number]    Date: 02/18/23  Time Spent: 1000  am - 1055 am : 55 Minutes  Treatment Type: Individual Therapy.  Reported Symptoms: Anxiety and marital stress related to issues with children.  Mental Status Exam: Appearance:  Casual     Behavior: Appropriate  Motor: Normal  Speech/Language:  Clear and Coherent  Affect: Flat  Mood: depressed  Thought process: normal  Thought content:   WNL  Sensory/Perceptual disturbances:   WNL  Orientation: oriented to person, place, time/date, situation, day of week, month of year, and year  Attention: Good  Concentration: Good  Memory: WNL  Fund of knowledge:  Good  Insight:   Good  Judgment:  Good  Impulse Control: Good   Risk Assessment: Danger to Self:  No Self-injurious Behavior: No Danger to Others: No Duty to Warn:no Physical Aggression / Violence:No  Access to Firearms a concern: No  Gang Involvement:No   Subjective:   Julia Romero participated from home, via video, and consented to treatment. Therapist participated from home office. We met online due to patient preference.  Morrison and her husband Julia Romero presented on time for their session. Both reported that things have not improved with their son or their daughter in how they are communicating with their mother. Julia Romero identified that throughout the marriage she has never felt backed by her husband and at times has been resentful. Patient and her husband both report they have been roommates for nearly 25 years. Julia Romero reported that she has felt that her belief system and her faith have kept her in the marriage.   Clinician encouraged the couple to find time to date again and possibly plan a small trip. Clinician also encouraged Julia Romero to speak with his primary doctor about his lack of desire for intimacy in order to attempt to improve the condition of the relationship.    Interventions: Cognitive Behavioral Therapy  Diagnosis: Generalized Anxiety Disorder   Plan: Julia Romero and Julia Romero are to use CBT, mindfulness and coping skills to help manage decrease symptoms associated with their issue with their children as well as their relationship.   Long-term goal:   Julia Romero and Julia Romero will reduce overall level, frequency, and intensity of the feelings of depression, anxiety and lack of intimacy evidenced by decreased irritability, negative self talk, and helpless feelings from 6 to 7 days/week to 0 to 2 days/week per client report for at least 3 consecutive months. Treatment pal to be reviewed by 01/07/2024.  Short-term goal:  Julia Romero and Julia Romero will Verbally express understanding of the relationship between feelings of depression, anxiety and their impact on thinking patterns and behaviors and their relationship. Verbalize an understanding of the role that distorted thinking plays in creating fears, excessive worry, and ruminations.  Julia Romero MSW, LCSW DATE:02/18/2023

## 2023-03-08 ENCOUNTER — Other Ambulatory Visit: Payer: Self-pay | Admitting: Family Medicine

## 2023-03-08 DIAGNOSIS — J3089 Other allergic rhinitis: Secondary | ICD-10-CM

## 2023-03-10 ENCOUNTER — Ambulatory Visit (INDEPENDENT_AMBULATORY_CARE_PROVIDER_SITE_OTHER): Payer: 59 | Admitting: Licensed Clinical Social Worker

## 2023-03-10 DIAGNOSIS — F411 Generalized anxiety disorder: Secondary | ICD-10-CM | POA: Diagnosis not present

## 2023-03-10 NOTE — Progress Notes (Signed)
[facility] Counselor/Therapist Progress Note  Patient ID: Julia Romero, MRN: [id number]    Date: 03/10/23  Time Spent: 0900  am - 0959 am : 59 Minutes  Treatment Type: Individual Therapy.  Reported Symptoms: Symptoms of anxiety and depression.  Mental Status Exam: Appearance:  Casual     Behavior: Appropriate  Motor: Normal  Speech/Language:  Clear and Coherent  Affect: Flat  Mood: depressed  Thought process: normal  Thought content:   WNL  Sensory/Perceptual disturbances:   WNL  Orientation: oriented to person, place, time/date, situation, day of week, month of year, and year  Attention: Good  Concentration: Good  Memory: WNL  Fund of knowledge:  Good  Insight:   Good  Judgment:  Good  Impulse Control: Good   Risk Assessment: Danger to Self:  No Self-injurious Behavior: No Danger to Others: No Duty to Warn:no Physical Aggression / Violence:No  Access to Firearms a concern: No  Gang Involvement:No   Subjective:   Julia Romero participated from home, via video, and consented to treatment. Therapist participated from home office. We met online due to Clinician being diagnosed with COVID  Clinician met with Julia Romero and her husband Julia Romero. Both were concerned about their son and their relationship with their children. The couple identified that they have had a roommate relationship for nearly 20 years and Julia Romero elaborated on the fact that she has always been the rule setter and felt very little support from her husband. The identified that this has carried over into their relationship with their children and how the children prefer to speak to their father than her. Most recent their son has moved in with his grandmother and reported that he want to transition to a female. Neither parent can remember a time that their son was anything other that masculine and feel that he is being impacted by the people he plays game with. Both identify they love their son but  do not agree with the decisions he is making. This continues to escalate conflict. Julia Romero identified that her anxiety and depression have greatly increased due to the turmoil in these relationships.   Both parents agree to work on building a healthier relationship and to celebrate the small wins. Things such as their daughter calling them and their son asking questions are opportunities to improve communication and work on a healthier relationship.  Interventions: Cognitive Behavioral Therapy  Diagnosis: Generalized Anxiety Disorder   Plan: Julia Romero and Julia Romero will use CBT, mindfulness and coping skills to help manage decrease symptoms associated with their diagnosis.   Long-term goal:   Julia Romero and Julia Romero will reduce overall level, frequency, and intensity of the feelings of depression and anxiety  evidenced by decreased irritability, negative self talk, and helpless feelings from 6 to 7 days/week to 0 to 2 days/week per client report for at least 3 consecutive months. Treatment plan to be reviewed by 01/07/2024.  Short-term goal:  Julia Romero and Julia Romero will verbally express understanding of the relationship between feelings of depression, anxiety and their impact on thinking patterns and behaviors. Verbalize an understanding of the role that distorted thinking plays in creating fears, excessive worry, and ruminations.  Julia Romero MSW, LCSW DATE:03/10/2023

## 2023-03-28 ENCOUNTER — Ambulatory Visit (INDEPENDENT_AMBULATORY_CARE_PROVIDER_SITE_OTHER): Payer: 59 | Admitting: Licensed Clinical Social Worker

## 2023-03-28 DIAGNOSIS — F411 Generalized anxiety disorder: Secondary | ICD-10-CM | POA: Diagnosis not present

## 2023-03-28 NOTE — Progress Notes (Signed)
[facility] Counselor/Therapist Progress Note  Patient ID: Julia Romero, MRN: [id number]    Date: 03/28/23  Time Spent: 1105  am - 1200 pm : 55 Minutes  Treatment Type: Individual Therapy.  Reported Symptoms: Generalized anxiety Disorder related to issues with son  Mental Status Exam: Appearance:  Casual     Behavior: Appropriate  Motor: Normal  Speech/Language:  Clear and Coherent  Affect: Appropriate  Mood: normal  Thought process: normal  Thought content:   WNL  Sensory/Perceptual disturbances:   WNL  Orientation: oriented to person, place, time/date, situation, day of week, month of year, and year  Attention: Good  Concentration: Good  Memory: WNL  Fund of knowledge:  Good  Insight:   Good  Judgment:  Good  Impulse Control: Good   Risk Assessment: Danger to Self:  No Self-injurious Behavior: No Danger to Others: No Duty to Warn:no Physical Aggression / Violence:No  Access to Firearms a concern: No  Gang Involvement:No   Subjective:   Wilhelmena Rickey Barbara participated from home, via video, and consented to treatment. Therapist participated from office. We met online due to patient request.  Rashelle and her spouse were engage din discussion and supportive of each other during the session. The session revolved around the anxiety that is stemming around their  daughter coming home this weekend and how that will look with the recent conflict over their son and his transition form a female to a female. Both parents are anxious but Zakirah being the most nervous. Discussion was had on effective communication skills with their daughter and the importance of setting boundaries to keep conversations positive.   Clinician encouraged increased engagement and participation through discussion focused on communication styles. Patients were educated on the different styles of communication including passive, aggressive, assertive, and passive-aggressive communication. Patient  and her spouse shared and reflected on which styles they most often find themselves communicating in and brainstormed strategies on how to transition and practice a more assertive approach. Further discussion explored how to use assertiveness skills and strategies to further advocate and ask questions as it relates to their relationships and family issues.  Interventions: Cognitive Behavioral Therapy  Diagnosis: Generalized anxiety disorder   Plan: Ramsey and [name] are to use CBT, mindfulness and coping skills to help manage decrease symptoms associated with their diagnosis.   Long-term goal:   Katilynn and [name] will reduce overall level, frequency, and intensity of the feelings of depression, anxiety and stress evidenced by decreased irritability, negative self talk, and helpless feelings from 6 to 7 days/week to 0 to 2 days/week per client report for at least 3 consecutive months. Treatment plan to be reviewed by 01/07/2024  Short-term goal:  Lougenia and [name] will verbally express understanding of the relationship between feelings of depression, anxiety and their impact on thinking patterns and behaviors. Verbalize an understanding of the role that distorted thinking plays in creating fears, excessive worry, and ruminations.  Phyllis Adeleigh MSW, LCSW DATE: 03/28/2023

## 2023-04-13 ENCOUNTER — Other Ambulatory Visit: Payer: Self-pay | Admitting: Family Medicine

## 2023-04-13 DIAGNOSIS — I10 Essential (primary) hypertension: Secondary | ICD-10-CM

## 2023-04-22 ENCOUNTER — Ambulatory Visit (INDEPENDENT_AMBULATORY_CARE_PROVIDER_SITE_OTHER): Payer: 59 | Admitting: Licensed Clinical Social Worker

## 2023-04-22 DIAGNOSIS — F411 Generalized anxiety disorder: Secondary | ICD-10-CM

## 2023-04-22 DIAGNOSIS — F331 Major depressive disorder, recurrent, moderate: Secondary | ICD-10-CM

## 2023-04-22 NOTE — Progress Notes (Signed)
[facility] Counselor/Therapist Progress Note  Patient ID: Julia Romero, MRN: [id number]    Date: 04/22/23  Time Spent: 1007  am - 1106 am : 59 Minutes  Treatment Type: Individual Therapy.  Reported Symptoms: Generalized Anxiety Disorder, related to issues with son transitioning an daughter feeling that are her parents are not accepting/supportive enough of their sons decision.  Mental Status Exam: Appearance:  Casual     Behavior: Appropriate  Motor: Normal  Speech/Language:  Clear and Coherent  Affect: Appropriate  Mood: normal  Thought process: normal  Thought content:   WNL  Sensory/Perceptual disturbances:   WNL  Orientation: oriented to person, place, time/date, situation, day of week, month of year, and year  Attention: Good  Concentration: Good  Memory: WNL  Fund of knowledge:  Good  Insight:   Good  Judgment:  Good  Impulse Control: Good   Risk Assessment: Danger to Self:  No Self-injurious Behavior: No Danger to Others: No Duty to Warn:no Physical Aggression / Violence:No  Access to Firearms a concern: No  Gang Involvement:No   Subjective:   Julia Romero Julia Romero participated from home, via video, and consented to treatment. Therapist participated from home office. We met online due to patient preference.  Patient and her husband presented for their session and fully engaged in discussion and problem solving activities. Julia Romero and Julia Romero reported that their daughter came in from [place] a few weeks ago for a couple of days for a friends party. They both report that there was tension in the home and nothing was discussed as to keep down the possibility of conflict. Julia Romero reports that the night before her daughter left she became tearful in wanting to talk with her daughter. Patient reports that her daughter called her by her name and told her to use her words. Patient stated she felt so disrespected and invalidated. Patient stated that she did not engage.  Clinician and patient and her spouse discussed that perhaps setting boundaries about not discussing things that are debatable with the kids and also make it clear that the home is a place of calm and that they will not allow the various views on politics, gender and religion be discussed or to tear their family apart.  Interventions: Cognitive Behavioral Therapy and Solution-Oriented/Positive Psychology, Supportive Therapy  Diagnosis: Generalized Anxiety Disorder   Plan: Julia Romero is to use CBT, mindfulness and coping skills to help manage decrease symptoms associated with their diagnosis.   Long-term goal:   Julia Romero will reduce overall level, frequency, and intensity of the feelings of depression and anxiety evidenced by decreased irritability, negative self talk, and helpless feelings from 6 to 7 days/week to 0 to 2 days/week per client report for at least 3 consecutive months. Treatment plan to be reviewed by 01/07/2024.  Short-term goal:  Julia Romero will verbally express understanding of the relationship between feelings of depression, anxiety and their impact on thinking patterns and behaviors. Verbalize an understanding of the role that distorted thinking plays in creating fears, excessive worry, and ruminations.  Julia Romero MSW, LCSW DATE: 04/22/2023

## 2023-05-13 ENCOUNTER — Ambulatory Visit (INDEPENDENT_AMBULATORY_CARE_PROVIDER_SITE_OTHER): Payer: 59 | Admitting: Licensed Clinical Social Worker

## 2023-05-13 DIAGNOSIS — F331 Major depressive disorder, recurrent, moderate: Secondary | ICD-10-CM | POA: Diagnosis not present

## 2023-05-13 DIAGNOSIS — F411 Generalized anxiety disorder: Secondary | ICD-10-CM | POA: Diagnosis not present

## 2023-05-13 NOTE — Progress Notes (Unsigned)
[facility] Counselor/Therapist Progress Note  Patient ID: Julia Romero, MRN: [id number]    Date: 05/13/23  Time Spent: 0800  am - 0857 am : 57 Minutes  Treatment Type: Individual Therapy.  Reported Symptoms: Patient and her husband are struggling with Anxiety and depression related to son identifying as a female.  Mental Status Exam: Appearance:  Casual     Behavior: Appropriate  Motor: Normal  Speech/Language:  Clear and Coherent  Affect: Appropriate  Mood: normal  Thought process: normal  Thought content:   WNL  Sensory/Perceptual disturbances:   WNL  Orientation: oriented to person, place, time/date, situation, day of week, month of year, and year  Attention: Good  Concentration: Good  Memory: WNL  Fund of knowledge:  Good  Insight:   Good  Judgment:  Good  Impulse Control: Good   Risk Assessment: Danger to Self:  No Self-injurious Behavior: No Danger to Others: No Duty to Warn:no Physical Aggression / Violence:No  Access to Firearms a concern: No  Gang Involvement:No   Subjective:   Julia Romero participated from home, via video, and consented to treatment. Therapist participated from home office. We met online due to patient request.  Patient and her spouse met with Clinician via video. They reported that they continue to struggle with stress and worry. Julia Romero reports that she has made up her mind to not reach out anymore to her children. She reports she will be there if they need her but she is tired of feeling rejected. Julia Romero states that he struggles with sleep and frequent worry. He states that he thinks he hasn't slept a good sleep since June. Clinician encouraged Julia Romero to see his PCP to see if it's necessary to get on medication to assist him with getting good sleep. The couple shared that they are a bit discouraged that the Holiday's are quickly approaching yet they aren't looking forward to it due to the family issues with their children.  Clinician and the couple processed that going on with things as usual is the best approach and leaving everyone's participation up to the individuals. The couple are planning on attempting to nurture their relationship with each other. It was agreed that they need to remain united.  Interventions: Cognitive Behavioral Therapy  Diagnosis: Generalized Anxiety Disorder/ Major Depressive Disorder, recurrent, moderate   Plan: Julia Romero and Julia Romero  is to use CBT, mindfulness and coping skills to help manage decrease symptoms associated with their diagnosis.   Long-term goal:   Julia Romero and Julia Romero will reduce overall level, frequency, and intensity of the feelings of depression and anxiety  evidenced by       decreased irritability, negative self talk, and helpless feelings from 6 to 7 days/week to 0 to 2 days/week per client report for at least 3 consecutive months. Treatment plan to be reviewed by 01/07/2024.  Short-term goal:  Julia Romero and Julia Romero will verbally express understanding of the relationship between feelings of depression and anxiety and their impact on thinking patterns and behaviors. Verbalize an understanding of the role that distorted thinking plays in creating fears, excessive worry, and ruminations.  Julia Romero MSW, LCSW DATE: 05/13/2023

## 2023-08-03 ENCOUNTER — Telehealth: Payer: Self-pay | Admitting: Family Medicine

## 2023-08-03 NOTE — Telephone Encounter (Signed)
 Pt called she has bad allergies and someone recommended she take Quercetin with bromelain or quercetrin with fisetin And she wanted to check with you on this

## 2023-09-13 ENCOUNTER — Encounter: Payer: Self-pay | Admitting: Internal Medicine

## 2023-09-14 ENCOUNTER — Other Ambulatory Visit: Payer: Self-pay | Admitting: Family Medicine

## 2023-09-14 DIAGNOSIS — I10 Essential (primary) hypertension: Secondary | ICD-10-CM

## 2023-11-21 ENCOUNTER — Other Ambulatory Visit: Payer: Self-pay | Admitting: Family Medicine

## 2023-11-21 DIAGNOSIS — I10 Essential (primary) hypertension: Secondary | ICD-10-CM

## 2023-12-08 ENCOUNTER — Encounter: Payer: Self-pay | Admitting: Family Medicine

## 2023-12-08 ENCOUNTER — Ambulatory Visit: Admitting: Family Medicine

## 2023-12-08 VITALS — BP 116/80 | HR 76 | Ht 66.0 in | Wt 205.8 lb

## 2023-12-08 DIAGNOSIS — Z0000 Encounter for general adult medical examination without abnormal findings: Secondary | ICD-10-CM

## 2023-12-08 DIAGNOSIS — E785 Hyperlipidemia, unspecified: Secondary | ICD-10-CM

## 2023-12-08 DIAGNOSIS — B001 Herpesviral vesicular dermatitis: Secondary | ICD-10-CM

## 2023-12-08 DIAGNOSIS — I10 Essential (primary) hypertension: Secondary | ICD-10-CM

## 2023-12-08 DIAGNOSIS — Z860100 Personal history of colon polyps, unspecified: Secondary | ICD-10-CM | POA: Diagnosis not present

## 2023-12-08 DIAGNOSIS — Z2821 Immunization not carried out because of patient refusal: Secondary | ICD-10-CM

## 2023-12-08 DIAGNOSIS — F325 Major depressive disorder, single episode, in full remission: Secondary | ICD-10-CM | POA: Diagnosis not present

## 2023-12-08 DIAGNOSIS — E66811 Obesity, class 1: Secondary | ICD-10-CM | POA: Diagnosis not present

## 2023-12-08 DIAGNOSIS — E782 Mixed hyperlipidemia: Secondary | ICD-10-CM

## 2023-12-08 DIAGNOSIS — J3089 Other allergic rhinitis: Secondary | ICD-10-CM

## 2023-12-08 MED ORDER — ATORVASTATIN CALCIUM 20 MG PO TABS
20.0000 mg | ORAL_TABLET | Freq: Every day | ORAL | 3 refills | Status: AC
Start: 2023-12-08 — End: ?

## 2023-12-08 MED ORDER — MONTELUKAST SODIUM 10 MG PO TABS: 10.0000 mg | ORAL_TABLET | Freq: Every day | ORAL | 3 refills | Status: AC

## 2023-12-08 MED ORDER — AZELASTINE HCL 0.1 % NA SOLN: 2.0000 | Freq: Two times a day (BID) | NASAL | 3 refills | Status: DC

## 2023-12-08 MED ORDER — IRBESARTAN 150 MG PO TABS: 150.0000 mg | ORAL_TABLET | Freq: Every day | ORAL | 3 refills | Status: AC

## 2023-12-08 MED ORDER — FLUTICASONE PROPIONATE 50 MCG/ACT NA SUSP: 1.0000 | Freq: Every day | NASAL | 3 refills | Status: DC

## 2023-12-08 MED ORDER — VALACYCLOVIR HCL 1 G PO TABS: 1000.0000 mg | ORAL_TABLET | Freq: Two times a day (BID) | ORAL | 1 refills | Status: DC

## 2023-12-08 NOTE — Progress Notes (Signed)
 Complete physical exam  Patient: Julia Romero   DOB: 16-Jul-1961   63 y.o. Female  MRN: [id number]  Subjective:     Chief Complaint  Patient presents with   Annual Exam    Cpe. Fasting.     Julia Romero is a 63 y.o. female who presents today for a complete physical exam.  She reports consuming a general diet. Zomba once a week and eupiment use when feel like it  She generally feels well. She reports sleeping poorly.  She has a previous history of depression but has been doing quite nicely and is involved in counseling.  Apparently the stresses in her life have to deal with her children.  She no longer has difficulty with anxiety either.  She does have a history of colonic polyps and also now states that her mother apparently had a duodenal cancer.  She is scheduled follow-up colonoscopy in 10 years but plans to check concerning doing it sooner.  She does have underlying allergies and would like to continue on her present medication regimen.  She also wants refills on her Valtrex .  She gets regular mammograms.  Most recent fall risk assessment:    12/08/2023    1:47 PM  Fall Risk   Falls in the past year? 0  Number falls in past yr: 0  Injury with Fall? 0  Risk for fall due to : No Fall Risks  Follow up Falls evaluation completed     Most recent depression screenings:    12/08/2023    1:47 PM 01/08/2023   10:29 AM  PHQ 2/9 Scores  PHQ - 2 Score 2   PHQ- 9 Score 5      Information is confidential and restricted. Go to Review Flowsheets to unlock data.    Vision:Within last year and Dental: No current dental problems and Last dental visit: every 6 months.    Immunization History  Administered Date(s) Administered   DTaP 04/06/1995, 07/23/2005   Influenza,inj,Quad PF,6+ Mos 04/16/2013, 05/09/2014, 07/07/2015   PFIZER(Purple Top)SARS-COV-2 Vaccination 09/17/2019, 10/15/2019, 05/05/2020   PPD Test 04/06/1995   Tdap 07/23/2005, 10/13/2016    Health Maintenance  Topic  Date Due   HIV Screening  Never done   Zoster Vaccines- Shingrix (1 of 2) Never done   COVID-19 Vaccine (4 - 2024-25 season) 03/20/2023   INFLUENZA VACCINE  02/17/2024   MAMMOGRAM  10/27/2024   Cervical Cancer Screening (HPV/Pap Cotest)  10/21/2025   DTaP/Tdap/Td (5 - Td or Tdap) 10/14/2026   Colonoscopy  07/19/2029   Hepatitis C Screening  Completed   HPV VACCINES  Aged Out   Meningococcal B Vaccine  Aged Out    Patient Care Team: Watson Hacking, MD as PCP - General (Family Medicine)   Outpatient Medications Prior to Visit  Medication Sig   Biotin 5000 MCG SUBL Place 10,000 mcg under the tongue daily.   fluocinonide gel (LIDEX) 0.05 % fluocinonide 0.05 % topical gel  APPLY SPARINGLY TO THE AFFECTED AREA THREE TIMES DAILY UNTIL SYMPTOMS ARE RESOLVED.   TURMERIC PO Take 1,400 mg by mouth daily. Take 2 pills  a day   [DISCONTINUED] atorvastatin  (LIPITOR) 20 MG tablet TAKE 1 TABLET BY MOUTH DAILY   [DISCONTINUED] azelastine  (ASTELIN ) 0.1 % nasal spray Place 2 sprays into both nostrils 2 (two) times daily. Use in each nostril as directed   [DISCONTINUED] fluticasone  (FLONASE ) 50 MCG/ACT nasal spray PLACE 1 SPRAY INTO BOTH NOSTRILS DAILY.   [DISCONTINUED] irbesartan  (AVAPRO ) 150 MG  tablet TAKE 1 TABLET BY MOUTH DAILY   [DISCONTINUED] montelukast  (SINGULAIR ) 10 MG tablet TAKE 1 TABLET BY MOUTH AT  BEDTIME   [DISCONTINUED] valACYclovir  (VALTREX ) 1000 MG tablet Take 1 tablet (1,000 mg total) by mouth 2 (two) times daily. For one day   No facility-administered medications prior to visit.    Review of Systems  All other systems reviewed and are negative.   Family and social history as well as health maintenance and immunizations was reviewed.     Objective:    BP 116/80   Pulse 76   Ht 5\' 6"  (1.676 m)   Wt 205 lb 12.8 oz (93.4 kg)   LMP 03/19/2014   SpO2 96%   BMI 33.22 kg/m    Physical Exam  Alert and in no distress. Tympanic membranes and canals are normal. Pharyngeal  area is normal. Neck is supple without adenopathy or thyromegaly. Cardiac exam shows a regular sinus rhythm without murmurs or gallops. Lungs are clear to auscultation.       Assessment & Plan:      Routine general medical examination at a health care facility  Obesity (BMI 30.0-34.9) - Plan: CBC with Differential/Platelet, Comprehensive metabolic panel with GFR, Lipid panel  Depression, major, in remission (HCC)  Mixed hyperlipidemia - Plan: Lipid panel  History of colonic polyps  Essential hypertension - Plan: CBC with Differential/Platelet, Comprehensive metabolic panel with GFR, irbesartan  (AVAPRO ) 150 MG tablet  Hyperlipidemia, unspecified hyperlipidemia type - Plan: atorvastatin  (LIPITOR) 20 MG tablet  Non-seasonal allergic rhinitis, unspecified trigger - Plan: azelastine  (ASTELIN ) 0.1 % nasal spray, fluticasone  (FLONASE ) 50 MCG/ACT nasal spray, montelukast  (SINGULAIR ) 10 MG tablet  Herpes labialis - Plan: valACYclovir  (VALTREX ) 1000 MG tablet  Her medications were reviewed.  Briefly discussed the stresses in her life and encouraged her to continue in counseling when its appropriate.  Discussed immunizations however at this point she is not interested in any of them. Return in about 1 year (around 12/07/2024).      Ron Cobbs, MD

## 2023-12-09 ENCOUNTER — Ambulatory Visit: Payer: Self-pay | Admitting: Family Medicine

## 2023-12-09 LAB — COMPREHENSIVE METABOLIC PANEL WITH GFR
ALT: 27 IU/L (ref 0–32)
AST: 33 IU/L (ref 0–40)
Albumin: 4.7 g/dL (ref 3.9–4.9)
Alkaline Phosphatase: 105 IU/L (ref 44–121)
BUN/Creatinine Ratio: 17 (ref 12–28)
BUN: 11 mg/dL (ref 8–27)
Bilirubin Total: 0.5 mg/dL (ref 0.0–1.2)
CO2: 21 mmol/L (ref 20–29)
Calcium: 9.9 mg/dL (ref 8.7–10.3)
Chloride: 104 mmol/L (ref 96–106)
Creatinine, Ser: 0.65 mg/dL (ref 0.57–1.00)
Globulin, Total: 2.3 g/dL (ref 1.5–4.5)
Glucose: 103 mg/dL — ABNORMAL HIGH (ref 70–99)
Potassium: 4.2 mmol/L (ref 3.5–5.2)
Sodium: 143 mmol/L (ref 134–144)
Total Protein: 7 g/dL (ref 6.0–8.5)
eGFR: 100 mL/min/{1.73_m2} (ref >59)

## 2023-12-09 LAB — CBC WITH DIFFERENTIAL/PLATELET
Basophils Absolute: 0 10*3/uL (ref 0.0–0.2)
Basos: 0 %
EOS (ABSOLUTE): 0.1 10*3/uL (ref 0.0–0.4)
Eos: 1 %
Hematocrit: 43.6 % (ref 34.0–46.6)
Hemoglobin: 14.1 g/dL (ref 11.1–15.9)
Immature Grans (Abs): 0 10*3/uL (ref 0.0–0.1)
Immature Granulocytes: 0 %
Lymphocytes Absolute: 1.8 10*3/uL (ref 0.7–3.1)
Lymphs: 27 %
MCH: 28.8 pg (ref 26.6–33.0)
MCHC: 32.3 g/dL (ref 31.5–35.7)
MCV: 89 fL (ref 79–97)
Monocytes Absolute: 0.5 10*3/uL (ref 0.1–0.9)
Monocytes: 7 %
Neutrophils Absolute: 4.1 10*3/uL (ref 1.4–7.0)
Neutrophils: 65 %
Platelets: 232 10*3/uL (ref 150–450)
RBC: 4.89 x10E6/uL (ref 3.77–5.28)
RDW: 13 % (ref 11.7–15.4)
WBC: 6.5 10*3/uL (ref 3.4–10.8)

## 2023-12-09 LAB — LIPID PANEL
Chol/HDL Ratio: 3.2 ratio (ref 0.0–4.4)
Cholesterol, Total: 183 mg/dL (ref 100–199)
HDL: 57 mg/dL (ref >39)
LDL Chol Calc (NIH): 107 mg/dL — ABNORMAL HIGH (ref 0–99)
Triglycerides: 108 mg/dL (ref 0–149)
VLDL Cholesterol Cal: 19 mg/dL (ref 5–40)

## 2023-12-16 ENCOUNTER — Telehealth: Payer: Self-pay | Admitting: Family Medicine

## 2023-12-16 DIAGNOSIS — J3089 Other allergic rhinitis: Secondary | ICD-10-CM

## 2023-12-16 NOTE — Telephone Encounter (Signed)
 Copied from CRM [id number]. Topic: Clinical - Medication Question >> Dec 16, 2023 11:47 AM Julia Romero wrote: Reason for CRM: patient called stated the script for fluticasone  (FLONASE ) 50 MCG/ACT nasal spray needs to be updated. Patient says she is suppose to get 2 sprays in each nostil 2x a day and she only recvd 2 bottles when she was suppose to get a 9 day supply. Please f/u with patient

## 2023-12-17 MED ORDER — FLUTICASONE PROPIONATE 50 MCG/ACT NA SUSP
2.0000 | Freq: Every day | NASAL | 3 refills | Status: DC
Start: 2023-12-17 — End: 2023-12-19

## 2023-12-19 ENCOUNTER — Other Ambulatory Visit: Payer: Self-pay

## 2023-12-19 DIAGNOSIS — J3089 Other allergic rhinitis: Secondary | ICD-10-CM

## 2023-12-19 MED ORDER — FLUTICASONE PROPIONATE 50 MCG/ACT NA SUSP: 2.0000 | Freq: Every day | NASAL | 3 refills | Status: AC

## 2024-01-07 ENCOUNTER — Other Ambulatory Visit: Payer: Self-pay | Admitting: Family Medicine

## 2024-01-07 DIAGNOSIS — J3089 Other allergic rhinitis: Secondary | ICD-10-CM

## 2024-02-23 ENCOUNTER — Ambulatory Visit: Payer: Self-pay

## 2024-02-23 NOTE — Telephone Encounter (Signed)
 FYI Only or Action Required?: FYI only for provider.  Patient was last seen in primary care on 12/08/2023 by Joyce Norleen BROCKS, MD.  Called Nurse Triage reporting Knee Pain.  Symptoms began today.  Interventions attempted: Ice/heat application.  Symptoms are: left knee pain, felt like a pop unchanged.  Triage Disposition: See PCP When Office is Open (Within 3 Days) (overriding Home Care)  Patient/caregiver understands and will follow disposition?: Yes             Copied from CRM #[id number]. Topic: Clinical - Red Word Triage >> Feb 23, 2024  2:42 PM Graeme ORN wrote: Red Word that prompted transfer to Nurse Triage: Something popped in knee. Very painful. 8/10 if moving Reason for Disposition  Knee pain  Answer Assessment - Initial Assessment Questions 1. LOCATION and RADIATION: Where is the pain located?      Left knee. (Lateral side)  2. QUALITY: What does the pain feel like?  (e.g., sharp, dull, aching, burning)     Patient states she was walking and her knee popped.  3. SEVERITY: How bad is the pain? What does it keep you from doing?   (Scale 1-10; or mild, moderate, severe)     4/10 at this time. She states when she tried to move her leg it was a 8/10.  4. ONSET: When did the pain start? Does it come and go, or is it there all the time?     Today around 10-12.  5. RECURRENT: Have you had this pain before? If Yes, ask: When, and what happened then?     No.  6. SETTING: Has there been any recent work, exercise or other activity that involved that part of the body?      No.  7. AGGRAVATING FACTORS: What makes the knee pain worse? (e.g., walking, climbing stairs, running)     Bending, moving leg  8. ASSOCIATED SYMPTOMS: Is there any swelling or redness of the knee?     No swelling or redness.  9. OTHER SYMPTOMS: Do you have any other symptoms? (e.g., calf pain, chest pain, difficulty breathing, fever)     No.  10. PREGNANCY: Is there  any chance you are pregnant? When was your last menstrual period?       N/A.  Protocols used: Knee Pain-A-AH

## 2024-02-27 ENCOUNTER — Ambulatory Visit: Admitting: Family Medicine

## 2024-07-22 ENCOUNTER — Other Ambulatory Visit: Payer: Self-pay | Admitting: Family Medicine

## 2024-07-22 DIAGNOSIS — B001 Herpesviral vesicular dermatitis: Secondary | ICD-10-CM

## 2024-12-11 ENCOUNTER — Encounter: Payer: Self-pay | Admitting: Family Medicine
